# Patient Record
Sex: Female | Born: 1985 | Race: White | Hispanic: No | Marital: Married | Smoking: Former smoker
Health system: Southern US, Community
[De-identification: ages and names within clinical notes are randomized; demographics above are authoritative.]

## PROBLEM LIST (undated history)

## (undated) DIAGNOSIS — D649 Anemia, unspecified: Secondary | ICD-10-CM

## (undated) DIAGNOSIS — R87619 Unspecified abnormal cytological findings in specimens from cervix uteri: Secondary | ICD-10-CM

## (undated) DIAGNOSIS — N809 Endometriosis, unspecified: Secondary | ICD-10-CM

## (undated) HISTORY — DX: Anemia, unspecified: D64.9

## (undated) HISTORY — DX: Unspecified abnormal cytological findings in specimens from cervix uteri: R87.619

## (undated) HISTORY — PX: OTHER SURGICAL HISTORY: SHX169

## (undated) HISTORY — DX: Endometriosis, unspecified: N80.9

---

## 2008-08-20 ENCOUNTER — Ambulatory Visit: Payer: Self-pay | Admitting: Family Medicine

## 2008-10-08 ENCOUNTER — Ambulatory Visit: Payer: Self-pay

## 2009-02-20 DIAGNOSIS — R87619 Unspecified abnormal cytological findings in specimens from cervix uteri: Secondary | ICD-10-CM

## 2009-02-20 DIAGNOSIS — N809 Endometriosis, unspecified: Secondary | ICD-10-CM

## 2009-02-20 HISTORY — PX: LAPAROSCOPIC CHOLECYSTECTOMY: SUR755

## 2009-02-20 HISTORY — DX: Endometriosis, unspecified: N80.9

## 2009-02-20 HISTORY — DX: Unspecified abnormal cytological findings in specimens from cervix uteri: R87.619

## 2009-03-21 ENCOUNTER — Observation Stay: Payer: Self-pay

## 2009-03-22 ENCOUNTER — Inpatient Hospital Stay: Payer: Self-pay

## 2009-07-22 ENCOUNTER — Emergency Department: Payer: Self-pay | Admitting: Emergency Medicine

## 2009-08-02 ENCOUNTER — Ambulatory Visit: Payer: Self-pay | Admitting: General Surgery

## 2012-03-16 IMAGING — CR DG CHOLANGIOGRAM OPERATIVE
1 series · 1 of 1 positions shown · non-contrast
Comparison: none

REASON FOR EXAM: cholelithiasis
COMMENTS:

PROCEDURE:     DXR - DXR CHOLANGIOGRAM OP (INITIAL)  - August 02, 2009  [DATE]
RESULT:     A single spot film from an operative cholangiogram is submitted.
The visualized portions of the intrahepatic ducts appear normal. The common
bile duct is normal in caliber. No filling defects are identified.

[chole]
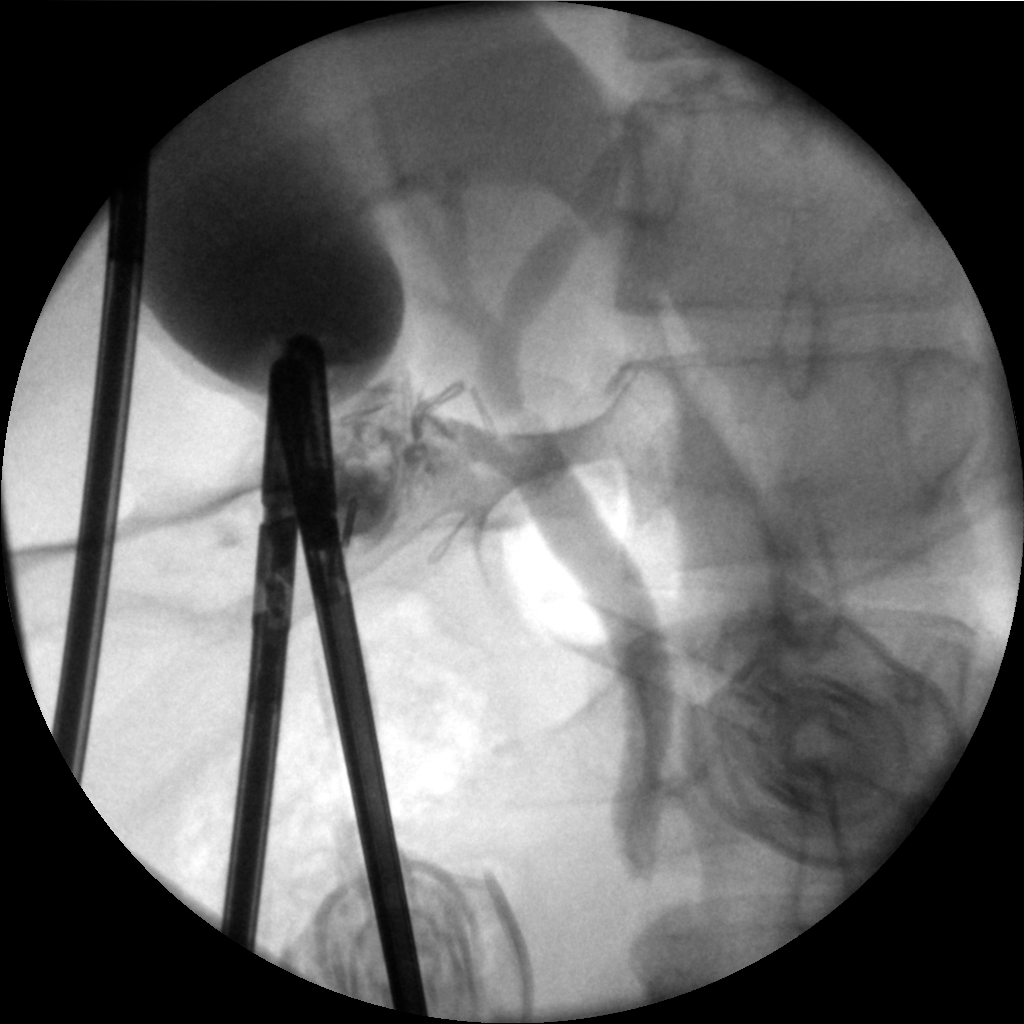

[1 of 1 positions shown; findings below may reference images not displayed]

IMPRESSION: I do not see evidence of a retained common bile duct stone.
Further interpretation is deferred to Dr. Arctica.

## 2014-04-20 LAB — HM PAP SMEAR

## 2017-04-30 ENCOUNTER — Telehealth: Payer: Self-pay

## 2017-04-30 NOTE — Telephone Encounter (Signed)
Let me know what questions she has

## 2017-04-30 NOTE — Telephone Encounter (Signed)
Pt has questions about nexplanon removal.  [phone] [facility].

## 2017-05-02 NOTE — Telephone Encounter (Signed)
Left detailed msg to call c exact questions she has.

## 2017-05-08 NOTE — Telephone Encounter (Signed)
Pt hasn't returned call.  Msg closed. 

## 2017-05-11 ENCOUNTER — Encounter: Payer: Self-pay | Admitting: Advanced Practice Midwife

## 2017-05-11 ENCOUNTER — Ambulatory Visit (INDEPENDENT_AMBULATORY_CARE_PROVIDER_SITE_OTHER): Payer: Medicaid Other | Admitting: Advanced Practice Midwife

## 2017-05-11 ENCOUNTER — Telehealth: Payer: Self-pay | Admitting: Advanced Practice Midwife

## 2017-05-11 VITALS — BP 136/62 | HR 84 | Ht 63.0 in | Wt 154.0 lb

## 2017-05-11 DIAGNOSIS — Z01419 Encounter for gynecological examination (general) (routine) without abnormal findings: Secondary | ICD-10-CM

## 2017-05-11 DIAGNOSIS — Z113 Encounter for screening for infections with a predominantly sexual mode of transmission: Secondary | ICD-10-CM

## 2017-05-11 DIAGNOSIS — Z0000 Encounter for general adult medical examination without abnormal findings: Secondary | ICD-10-CM

## 2017-05-11 DIAGNOSIS — Z124 Encounter for screening for malignant neoplasm of cervix: Secondary | ICD-10-CM

## 2017-05-11 NOTE — Telephone Encounter (Signed)
Patient scheduled 3/29 for nexplanon replacement with JEG

## 2017-05-14 ENCOUNTER — Encounter: Payer: Self-pay | Admitting: Advanced Practice Midwife

## 2017-05-14 NOTE — Patient Instructions (Signed)
Preventive Care 18-39 Years, Female Preventive care refers to lifestyle choices and visits with your health care provider that can promote health and wellness. What does preventive care include?  A yearly physical exam. This is also called an annual well check.  Dental exams once or twice a year.  Routine eye exams. Ask your health care provider how often you should have your eyes checked.  Personal lifestyle choices, including: ? Daily care of your teeth and gums. ? Regular physical activity. ? Eating a healthy diet. ? Avoiding tobacco and drug use. ? Limiting alcohol use. ? Practicing safe sex. ? Taking vitamin and mineral supplements as recommended by your health care provider. What happens during an annual well check? The services and screenings done by your health care provider during your annual well check will depend on your age, overall health, lifestyle risk factors, and family history of disease. Counseling Your health care provider may ask you questions about your:  Alcohol use.  Tobacco use.  Drug use.  Emotional well-being.  Home and relationship well-being.  Sexual activity.  Eating habits.  Work and work Statistician.  Method of birth control.  Menstrual cycle.  Pregnancy history.  Screening You may have the following tests or measurements:  Height, weight, and BMI.  Diabetes screening. This is done by checking your blood sugar (glucose) after you have not eaten for a while (fasting).  Blood pressure.  Lipid and cholesterol levels. These may be checked every 5 years starting at age 38.  Skin check.  Hepatitis C blood test.  Hepatitis B blood test.  Sexually transmitted disease (STD) testing.  BRCA-related cancer screening. This may be done if you have a family history of breast, ovarian, tubal, or peritoneal cancers.  Pelvic exam and Pap test. This may be done every 3 years starting at age 38. Starting at age 30, this may be done  every 5 years if you have a Pap test in combination with an HPV test.  Discuss your test results, treatment options, and if necessary, the need for more tests with your health care provider. Vaccines Your health care provider may recommend certain vaccines, such as:  Influenza vaccine. This is recommended every year.  Tetanus, diphtheria, and acellular pertussis (Tdap, Td) vaccine. You may need a Td booster every 10 years.  Varicella vaccine. You may need this if you have not been vaccinated.  HPV vaccine. If you are 39 or younger, you may need three doses over 6 months.  Measles, mumps, and rubella (MMR) vaccine. You may need at least one dose of MMR. You may also need a second dose.  Pneumococcal 13-valent conjugate (PCV13) vaccine. You may need this if you have certain conditions and were not previously vaccinated.  Pneumococcal polysaccharide (PPSV23) vaccine. You may need one or two doses if you smoke cigarettes or if you have certain conditions.  Meningococcal vaccine. One dose is recommended if you are age 68-21 years and a first-year college student living in a residence hall, or if you have one of several medical conditions. You may also need additional booster doses.  Hepatitis A vaccine. You may need this if you have certain conditions or if you travel or work in places where you may be exposed to hepatitis A.  Hepatitis B vaccine. You may need this if you have certain conditions or if you travel or work in places where you may be exposed to hepatitis B.  Haemophilus influenzae type b (Hib) vaccine. You may need this  if you have certain risk factors.  Talk to your health care provider about which screenings and vaccines you need and how often you need them. This information is not intended to replace advice given to you by your health care provider. Make sure you discuss any questions you have with your health care provider. Document Released: 04/04/2001 Document Revised:  10/27/2015 Document Reviewed: 12/08/2014 Elsevier Interactive Patient Education  2018 Elsevier Inc.  

## 2017-05-14 NOTE — Telephone Encounter (Signed)
Nexplanon reserved or this patient.

## 2017-05-14 NOTE — Progress Notes (Addendum)
Patient ID: Megan Bell, female   DOB: [birth year], 32 y.o.   MRN: [id number]     Gynecology Annual Exam   PCP: Patient, No Pcp Per  Chief Complaint:  Chief Complaint  Patient presents with  . Gynecologic Exam    needs nexplanon removal/reinsertion; options in between now and insertion    History of Present Illness: Patient is a 32 y.o. G1P1001 presents for annual exam. The patient has no complaints today. She is due to have her Nexplanon removed and a new one inserted. She will schedule that appointment.   LMP: Patient's last menstrual period was 05/05/2017. She had a period beginning March 16 which was the first period she has had in 9 years.  Postcoital Bleeding: no Dysmenorrhea: no  The patient is sexually active. She currently uses Nexplanon for contraception. She denies dyspareunia.  The patient does perform self breast exams.  There is no notable family history of breast or ovarian cancer in her family. Her paternal grandmother was diagnosed with breast cancer at age greater than 1.  The patient wears seatbelts: yes.   The patient has regular exercise: yes.  She is a 1/2 PPD cigarette smoker x 8 years. She is not ready to quit but is encouraged to quit.   The patient denies current symptoms of depression.  She has some anxiety but copes well without medication.  Review of Systems: Review of Systems  Constitutional: Negative.   HENT: Negative.   Eyes: Negative.   Respiratory: Negative.   Cardiovascular: Negative.   Gastrointestinal: Negative.   Genitourinary: Negative.   Musculoskeletal: Negative.   Skin: Negative.   Neurological: Negative.   Endo/Heme/Allergies: Negative.   Psychiatric/Behavioral: Negative.     Past Medical History:  Past Medical History:  Diagnosis Date  . Abnormal Pap smear of cervix 2011   didn't f/u  . Endometriosis 2011   of ovarian cyst during preg on u/s 2011    Past Surgical History:  Past Surgical History:  Procedure  Laterality Date  . LAPAROSCOPIC CHOLECYSTECTOMY  2011   Dr. Evette Cristal    Gynecologic History:  Patient's last menstrual period was 05/05/2017. Contraception: Nexplanon Last Pap: 3 years ago Results were: low-grade squamous intraepithelial neoplasia (LGSIL - encompassing HPV,mild dysplasia,CIN I) She has been unable to afford f/u until now due to lack of insurance.  Obstetric History: G1P1001  Family History:  Family History  Problem Relation Age of Onset  . Heart disease Maternal Grandfather        triple bypass  . Breast cancer Paternal Grandmother 36    Social History:  Social History   Socioeconomic History  . Marital status: Single    Spouse name: Not on file  . Number of children: 1  . Years of education: 38  . Highest education level: Not on file  Occupational History  . Occupation: pharm tech    Comment: Designer, jewellery  Social Needs  . Financial resource strain: Not on file  . Food insecurity:    Worry: Not on file    Inability: Not on file  . Transportation needs:    Medical: Not on file    Non-medical: Not on file  Tobacco Use  . Smoking status: Current Every Day Smoker    Packs/day: 1.00  . Smokeless tobacco: Never Used  Substance and Sexual Activity  . Alcohol use: Yes    Comment: 2/hs  . Drug use: No  . Sexual activity: Yes    Birth control/protection: Implant  Lifestyle  .  Physical activity:    Days per week: Not on file    Minutes per session: Not on file  . Stress: Not on file  Relationships  . Social connections:    Talks on phone: Not on file    Gets together: Not on file    Attends religious service: Not on file    Active member of club or organization: Not on file    Attends meetings of clubs or organizations: Not on file    Relationship status: Not on file  . Intimate partner violence:    Fear of current or ex partner: Not on file    Emotionally abused: Not on file    Physically abused: Not on file    Forced sexual activity: Not on  file  Other Topics Concern  . Not on file  Social History Narrative  . Not on file    Allergies:  No Known Allergies  Medications: Prior to Admission medications   Medication Sig Start Date End Date Taking? Authorizing Provider  etonogestrel (NEXPLANON) 68 MG IMPL implant Inject 1 each into the skin continuous.   Yes [provider]  ibuprofen (ADVIL,MOTRIN) 200 MG tablet Take 2 tablets by mouth daily.   Yes [provider]    Physical Exam Vitals: Blood pressure 136/62, pulse 84, height 5\' 3"  (1.6 m), weight 154 lb (69.9 kg), last menstrual period 05/05/2017.  General: NAD HEENT: normocephalic, anicteric Thyroid: no enlargement, no palpable nodules Pulmonary: No increased work of breathing, CTAB Cardiovascular: RRR, distal pulses 2+ Breast: Breast symmetrical, no tenderness, no palpable nodules or masses, no skin or nipple retraction present, no nipple discharge.  No axillary or supraclavicular lymphadenopathy. Abdomen: NABS, soft, non-tender, non-distended.  Umbilicus without lesions.  No hepatomegaly, splenomegaly or masses palpable. No evidence of hernia  Genitourinary:  External: Normal external female genitalia.  Normal urethral meatus, normal Bartholin's and Skene's glands.    Vagina: Normal vaginal mucosa, no evidence of prolapse.    Cervix: Grossly normal in appearance, no bleeding  Uterus: deferred for no symptoms/shared decision making  Adnexa: deferred for no symptoms/shared decision making  Rectal: deferred  Lymphatic: no evidence of inguinal lymphadenopathy Extremities: no edema, erythema, or tenderness Neurologic: Grossly intact Psychiatric: mood appropriate, affect full   Assessment: 32 y.o. G1P1001 routine annual exam  Plan: Problem List Items Addressed This Visit    None    Visit Diagnoses    Well woman exam with routine gynecological exam    -  Primary   Relevant Orders   IGP,CtNgTv,Apt HPV,rfx16/18,45   Screen for sexually  transmitted diseases       Relevant Orders   IGP,CtNgTv,Apt HPV,rfx16/18,45   Cervical cancer screening       Relevant Orders   IGP,CtNgTv,Apt HPV,rfx16/18,45      2) STI screening  wasoffered and accepted  2)  ASCCP guidelines and rational discussed.  Patient opts for every 3 years screening interval if results of PAP today are normal  3) Contraception - the patient is currently using  Nexplanon.  She is happy with her current form of contraception and plans to continue  4) Routine healthcare maintenance including cholesterol, diabetes screening discussed Declines   5) Smoking cessation encouraged  6) Return for Nexplanon removal and reinsertion appointment and in 1 year for annual.   Megan Bell, CNM [facility], [facility] 05/14/2017, 1:34 PM

## 2017-05-15 LAB — IGP,CTNGTV,APT HPV,RFX16/18,45
CHLAMYDIA, NUC. ACID AMP: NEGATIVE
Gonococcus, Nuc. Acid Amp: NEGATIVE
HPV Aptima: NEGATIVE
PAP SMEAR COMMENT: 0
Trich vag by NAA: NEGATIVE

## 2017-05-18 ENCOUNTER — Ambulatory Visit (INDEPENDENT_AMBULATORY_CARE_PROVIDER_SITE_OTHER): Payer: Medicaid Other | Admitting: Advanced Practice Midwife

## 2017-05-18 ENCOUNTER — Encounter: Payer: Self-pay | Admitting: Advanced Practice Midwife

## 2017-05-18 VITALS — BP 124/72 | HR 76 | Ht 63.0 in | Wt 152.0 lb

## 2017-05-18 DIAGNOSIS — Z3046 Encounter for surveillance of implantable subdermal contraceptive: Secondary | ICD-10-CM

## 2017-05-18 DIAGNOSIS — Z30017 Encounter for initial prescription of implantable subdermal contraceptive: Secondary | ICD-10-CM

## 2017-05-18 NOTE — Telephone Encounter (Signed)
Nexplanon received/charged 05/18/17

## 2017-05-18 NOTE — Progress Notes (Signed)
GYNECOLOGY PROCEDURE NOTE  Nexplanon removal discussed in detail.  Risks of infection, bleeding, nerve injury all reviewed.  Patient understands risks and desires to proceed.  Verbal consent obtained.  Patient is certain she wants the Nexplanon removed.  All questions answered.  Procedure: Patient placed in dorsal supine with right arm above head, elbow flexed at 90 degrees, arm resting on examination table.  Nexplanon identified without problems.  Betadine scrub x3.  2 ml of 1% lidocaine injected under Nexplanon device without problems.  Sterile gloves applied.  Small 0.5cm incision made at distal tip of Nexplanon device with 11 blade scalpel.  Nexplanon brought to incision and grasped with a small kelly clamp.  Nexplanon removed intact without problems.  Pressure applied to incision.  Patient tolerated procedure well.  No complications.   Assessment: 32 y.o. year old female now s/p uncomplicated Nexplanon removal.  Plan: 1.  Patient given post procedure precautions and asked to call for fever, chills, redness or drainage from her incision, bleeding from incision.  She understands she will likely have a small bruise near site of removal and can remove bandage tomorrow and steri-strips in approximately 1 week.  2) Contraception: reinsertion of Nexplanon today. See following note  Tresea MallJane Kaylean Tupou, [place]   [id number] for lidocaine block, [phone] for nexplanon removal     GYNECOLOGY PROCEDURE NOTE  Patient is a 32 y.o. G1P1001 presenting for Nexplanon insertion as her desired means of contraception.  She provided informed consent, signed copy in the chart, time out was performed. Self reported LMP of Patient's last menstrual period was 05/05/2017.  She understands that Nexplanon is a progesterone only therapy, and that patients often  have irregular and unpredictable vaginal bleeding or amenorrhea. She understands that other side effects are possible related to systemic progesterone, including but  not limited to, headaches, breast tenderness, nausea, and irritability. While effective at preventing pregnancy long acting reversible contraceptives do not prevent transmission of sexually transmitted diseases and use of barrier methods for this purpose was discussed. The placement procedure for Nexplanon was reviewed with the patient in detail including risks of nerve injury, infection, bleeding and injury to other muscles or tendons. She understands that the Nexplanon implant is good for 3 years and needs to be removed at the end of that time.  She understands that Nexplanon is an extremely effective option for contraception, with failure rate of <1%. This information is reviewed today and all questions were answered. Informed consent was obtained, both verbally and written.   The patient is healthy and has no contraindications to Nexplanon use.   Procedure Appropriate time out taken.  Patient in dorsal supine with right arm above head, elbow flexed at 90 degrees, arm resting on examination table.  Nexplanon removed form sterile blister packaging,  Device confirmed in needle, before inserting full length of needle into previous Nexplanon trac, tenting up the skin as the needle was advanced.  The drug eluding rod was then deployed by pulling back the slider per the manufactures recommendation.  The implant was palpable by the clinician as well as the patient.  The insertion site covered dressed with a band aid before applying  a kerlex bandage pressure dressing..Minimal blood loss was noted during the procedure.  The patient tolerated the procedure well.   She was instructed to wear the bandage for 24 hours, call with any signs of infection.  She was given the Nexplanon card and instructed to have the rod removed in 3 years.  Tresea MallJane Merwin Breden,  CNM   Charge [phone] for nexplanon device, CPT [id number] for procedure J2001 for lidocaine administration Modifer 25, plus Modifer 79 is done during a global billing  visit

## 2017-07-03 ENCOUNTER — Encounter: Payer: Self-pay | Admitting: Advanced Practice Midwife

## 2017-07-04 ENCOUNTER — Telehealth: Payer: Self-pay

## 2017-07-04 ENCOUNTER — Other Ambulatory Visit: Payer: Self-pay | Admitting: Advanced Practice Midwife

## 2017-07-04 DIAGNOSIS — Z3200 Encounter for pregnancy test, result unknown: Secondary | ICD-10-CM

## 2017-07-04 NOTE — Telephone Encounter (Signed)
Pt calling triage today. Has nexplanon and having some abdominal pain, breast tenderness, late on period and usually has them monthly with nexplanon. Pt has taken tests and all were obvious negative, but 1 she said was questionable and she saw a very faint line. Pt does not want to wait for an appt and wants to be seen today since she has availability today. She said she would go to [facility] to see what is going on. I advised pt to have them fax over visit from today if she goes. Pt will call back if she needs to schedule a follow up visit with more concerns. Pt very appreciative of call back and help.

## 2017-07-04 NOTE — Telephone Encounter (Signed)
Please advise scheduling lab .

## 2017-07-04 NOTE — Progress Notes (Signed)
Beta Hcg lab order entered for patient to come in for lab draw.

## 2017-07-05 ENCOUNTER — Telehealth: Payer: Self-pay | Admitting: Advanced Practice Midwife

## 2017-07-05 NOTE — Telephone Encounter (Signed)
Please schedule lab appointment for this patient. Thank you,  [name]    ----- Message -----  From: Kathi Ludwig  Sent: 07/04/2017  7:53 AM  To: Tresea Mall, CNM  Subject: Appointment Request                 ----- Message from Kathi Ludwig sent at 07/04/2017 7:53 AM EDT -----      ----- Message from Elisabeth Most D sent at 07/03/2017 5:22 PM -----   Appointment Request From: Megan Bell    With Provider: Tresea Mall, CNM [[facility]]    Preferred Date Range: 07/04/2017 - 07/04/2017    Preferred Times: Wednesday Afternoon    Reason for visit: Request an Appointment    Comments:  Blood work to check hormone levels and to check for pregnancy   Left voicemail for patient to call back to be schedule

## 2019-08-13 ENCOUNTER — Other Ambulatory Visit: Payer: Self-pay

## 2019-08-13 ENCOUNTER — Encounter: Payer: Self-pay | Admitting: Advanced Practice Midwife

## 2019-08-13 ENCOUNTER — Ambulatory Visit (INDEPENDENT_AMBULATORY_CARE_PROVIDER_SITE_OTHER): Payer: Medicaid Other | Admitting: Advanced Practice Midwife

## 2019-08-13 VITALS — BP 122/74 | Ht 64.0 in | Wt 204.0 lb

## 2019-08-13 DIAGNOSIS — Z0000 Encounter for general adult medical examination without abnormal findings: Secondary | ICD-10-CM

## 2019-08-13 DIAGNOSIS — Z3046 Encounter for surveillance of implantable subdermal contraceptive: Secondary | ICD-10-CM | POA: Diagnosis not present

## 2019-08-13 NOTE — Progress Notes (Signed)
Gynecology Annual Exam   PCP: Patient, No Pcp Per  Chief Complaint:  Chief Complaint  Patient presents with  . Annual Exam    History of Present Illness: Patient is a 34 y.o. G1P1001 presents for annual exam. The patient has no gyn complaints today. She reports some weight gain during covid due to stress, lack of exercise, soda drinking. She would like to have her Nexplanon out today as she and her husband are contemplating conception. Her main question regarding that is the condition of her teeth. She has significant decay and denies infection. She wonders if that is a concern for a possible pregnancy. Her long term repair will be full extraction with dentures.  LMP: No LMP recorded. Average Interval: regular, every 2 months  Duration of flow: 2 or 3 days Heavy Menses: no Clots: no Intermenstrual Bleeding: no Postcoital Bleeding: no Dysmenorrhea: no  The patient is sexually active. She currently uses Nexplanon for contraception. She denies dyspareunia.  The patient does perform self breast exams.  There is no notable family history of breast or ovarian cancer in her family.  The patient wears seatbelts: yes.   The patient has regular exercise: she is now walking some days and hopes to increase her physical activity level. Her main dietary weakness is soda drinking. She has been making efforts to eat healthier. She does admit 1 or 2 mixed drinks about 3-4 nights per week. She is trying to cut down/quit smoking and is down to 1/2 PPD from 1.5 PPD. She admits adequate sleep.    The patient denies current symptoms of depression.  She does have some situational anxiety related to custody of her oldest child.  Review of Systems: Review of Systems  Constitutional: Negative for chills and fever.  HENT: Negative for congestion, ear discharge, ear pain, hearing loss, sinus pain and sore throat.        Positive for tooth decay  Eyes: Negative for blurred vision and double vision.    Respiratory: Negative for cough, shortness of breath and wheezing.   Cardiovascular: Negative for chest pain, palpitations and leg swelling.  Gastrointestinal: Negative for abdominal pain, blood in stool, constipation, diarrhea, heartburn, melena, nausea and vomiting.  Genitourinary: Negative for dysuria, flank pain, frequency, hematuria and urgency.  Musculoskeletal: Negative for back pain, joint pain and myalgias.  Skin: Negative for itching and rash.  Neurological: Negative for dizziness, tingling, tremors, sensory change, speech change, focal weakness, seizures, loss of consciousness, weakness and headaches.  Endo/Heme/Allergies: Negative for environmental allergies. Does not bruise/bleed easily.  Psychiatric/Behavioral: Negative for depression, hallucinations, memory loss, substance abuse and suicidal ideas. The patient is not nervous/anxious and does not have insomnia.     Past Medical History:  There are no problems to display for this patient.   Past Surgical History:  Past Surgical History:  Procedure Laterality Date  . LAPAROSCOPIC CHOLECYSTECTOMY  2011   Dr. Evette Cristal    Gynecologic History:  No LMP recorded. Contraception: Nexplanon Last Pap: 2 years ago Results were: no abnormalities   Obstetric History: G1P1001  Family History:  Family History  Problem Relation Age of Onset  . Heart disease Maternal Grandfather        triple bypass  . Breast cancer Paternal Grandmother 41    Social History:  Social History   Socioeconomic History  . Marital status: Single    Spouse name: Not on file  . Number of children: 1  . Years of education: 7  . Highest  education level: Not on file  Occupational History  . Occupation: pharm tech    Comment: Public house manager  Tobacco Use  . Smoking status: Current Every Day Smoker    Packs/day: 0.50    Types: Cigarettes, E-cigarettes  . Smokeless tobacco: Never Used  Vaping Use  . Vaping Use: Never used  Substance and Sexual  Activity  . Alcohol use: Yes    Comment: 2/hs  . Drug use: No  . Sexual activity: Yes    Birth control/protection: Implant  Other Topics Concern  . Not on file  Social History Narrative  . Not on file   Social Determinants of Health   Financial Resource Strain:   . Difficulty of Paying Living Expenses:   Food Insecurity:   . Worried About Charity fundraiser in the Last Year:   . Arboriculturist in the Last Year:   Transportation Needs:   . Film/video editor (Medical):   Marland Kitchen Lack of Transportation (Non-Medical):   Physical Activity:   . Days of Exercise per Week:   . Minutes of Exercise per Session:   Stress:   . Feeling of Stress :   Social Connections:   . Frequency of Communication with Friends and Family:   . Frequency of Social Gatherings with Friends and Family:   . Attends Religious Services:   . Active Member of Clubs or Organizations:   . Attends Archivist Meetings:   Marland Kitchen Marital Status:   Intimate Partner Violence:   . Fear of Current or Ex-Partner:   . Emotionally Abused:   Marland Kitchen Physically Abused:   . Sexually Abused:     Allergies:  No Known Allergies  Medications: Prior to Admission medications   Medication Sig Start Date End Date Taking? Authorizing Provider  ibuprofen (ADVIL,MOTRIN) 200 MG tablet Take 2 tablets by mouth daily.   Yes [provider]  Cetirizine-Pseudoephedrine (ZYRTEC-D PO) Take by mouth.    [provider]    Physical Exam Vitals: Blood pressure 122/74, height 5\' 4"  (1.626 m), weight 204 lb (92.5 kg).  General: NAD HEENT: normocephalic, anicteric, significant tooth decay, gums without significant inflammation Thyroid: no enlargement, no palpable nodules Pulmonary: No increased work of breathing, CTAB Cardiovascular: RRR, distal pulses 2+ Breast: Breast symmetrical, no tenderness, no palpable nodules or masses, no skin or nipple retraction present, no nipple discharge.  No axillary or supraclavicular  lymphadenopathy. Abdomen: NABS, soft, non-tender, non-distended.  Umbilicus without lesions.  No hepatomegaly, splenomegaly or masses palpable. No evidence of hernia  Genitourinary: deferred for no concerns and PAP interval Extremities: no edema, erythema, or tenderness Neurologic: Grossly intact Psychiatric: mood appropriate, affect full    Assessment: 34 y.o. G1P1001 routine annual exam  Plan: Problem List Items Addressed This Visit    None    Visit Diagnoses    Well woman exam without gynecological exam    -  Primary   Nexplanon removal          1) STI screening  was offered and declined  2)  ASCCP guidelines and rationale discussed.  Patient opts for every 3 years screening interval  3) Contraception - the patient is currently using  Nexplanon.  She is attempting to conceive within the next year and is fine either way if it happens or not   4) Routine healthcare maintenance including cholesterol, diabetes screening discussed managed by PCP   5) Smoking cessation encouraged  6) Increase healthy lifestyle, diet/exercise  7) Follow up with  dentist for tooth decay   8) Return in about 1 year (around 08/12/2020) for annual established gyn.   Tresea Mall, CNM [facility] 08/13/2019, 4:49 PM     GYNECOLOGY PROCEDURE NOTE  Nexplanon removal discussed in detail.  Risks of infection, bleeding, nerve injury all reviewed.  Patient understands risks and desires to proceed.  Verbal consent obtained.  Patient is certain she wants the Nexplanon removed.  All questions answered.  Procedure: Patient placed in dorsal supine with right arm above head, elbow flexed at 90 degrees, arm resting on examination table.  Nexplanon identified without problems.  Betadine scrub x2.  1.5 ml of 1% lidocaine injected under Nexplanon device without problems.  Sterile gloves applied.  Small 0.5cm incision made at distal tip of Nexplanon device with 11 blade scalpel.   Nexplanon brought to incision and grasped with a small kelly clamp.  Nexplanon removed intact without problems.  Pressure applied to incision.  Hemostasis obtained.  Steri-strips applied, followed by bandage and compression dressing.  Patient tolerated procedure well.  No complications.   Assessment: 34 y.o. year old female now s/p uncomplicated Nexplanon removal.  Plan: 1.  Patient given post procedure precautions and asked to call for fever, chills, redness or drainage from her incision, bleeding from incision.  She understands she will likely have a small bruise near site of removal and can remove bandage tomorrow and steri-strips in approximately 1 week.  2) Contraception: attempting conception  [id number] for lidocaine block, [id number] for nexplanon removal  Parke Poisson, CNM [facility] 08/13/2019, 5:05 PM

## 2019-10-15 DIAGNOSIS — Z20822 Contact with and (suspected) exposure to covid-19: Secondary | ICD-10-CM | POA: Diagnosis not present

## 2019-12-29 ENCOUNTER — Ambulatory Visit (INDEPENDENT_AMBULATORY_CARE_PROVIDER_SITE_OTHER): Payer: Medicaid Other | Admitting: Obstetrics and Gynecology

## 2019-12-29 ENCOUNTER — Other Ambulatory Visit (HOSPITAL_COMMUNITY)
Admission: RE | Admit: 2019-12-29 | Discharge: 2019-12-29 | Disposition: A | Discharge status: Home / Self Care | Payer: Medicaid Other | Source: Ambulatory Visit | Attending: Obstetrics and Gynecology | Admitting: Obstetrics and Gynecology

## 2019-12-29 ENCOUNTER — Other Ambulatory Visit: Payer: Self-pay

## 2019-12-29 ENCOUNTER — Encounter: Payer: Self-pay | Admitting: Obstetrics and Gynecology

## 2019-12-29 VITALS — Wt 197.4 lb

## 2019-12-29 DIAGNOSIS — Z113 Encounter for screening for infections with a predominantly sexual mode of transmission: Secondary | ICD-10-CM | POA: Insufficient documentation

## 2019-12-29 DIAGNOSIS — Z3480 Encounter for supervision of other normal pregnancy, unspecified trimester: Secondary | ICD-10-CM

## 2019-12-29 DIAGNOSIS — N926 Irregular menstruation, unspecified: Secondary | ICD-10-CM

## 2019-12-29 DIAGNOSIS — Z3689 Encounter for other specified antenatal screening: Secondary | ICD-10-CM

## 2019-12-29 LAB — POCT URINE PREGNANCY: Preg Test, Ur: POSITIVE — AB

## 2019-12-29 LAB — OB RESULTS CONSOLE VARICELLA ZOSTER ANTIBODY, IGG: Varicella: IMMUNE

## 2019-12-29 NOTE — Progress Notes (Signed)
Pt here for a New OB  Visit. Pt is un sure of when her last LMP was because she was on the Nexplanon.

## 2019-12-29 NOTE — Progress Notes (Signed)
New Obstetric Patient H&P    Chief Complaint: "Desires prenatal care"   History of Present Illness: Patient is a 34 y.o. G2P1001 Not Hispanic or Latino female, presents with amenorrhea and positive home pregnancy test. No LMP recorded (lmp unknown). Patient is pregnant. and is uncertain of her last period. Her last pap smear was 05/12/2019 and was NILM HPV negative.    She had a urine pregnancy test which was positive on 12/04/2019. Since her LMP she claims she has experienced fatigue, nausea, breast tenderness. She denies vaginal bleeding. Her past medical history is noncontributory. Her prior pregnancies are notable for no complication  Since her LMP, she admits to the use of tobacco products  no There are cats in the home in the home  yes indoor She admits close contact with children on a regular basis  yes  She has had chicken pox in the past yes She has had Tuberculosis exposures, symptoms, or previously tested positive for TB   no Current or past history of domestic violence. no  Genetic Screening/Teratology Counseling: (Includes patient, baby's father, or anyone in either family with:)   1. Patient's age >/= 13 at [facility]  no 2. Thalassemia ([place], [place], Mediterranean, or Asian background): MCV<80  no 3. Neural tube defect (meningomyelocele, spina bifida, anencephaly)  no 4. Congenital heart defect  no  5. Down syndrome  no 6. Tay-Sachs (Jewish, [place]  no 7. Canavan's Disease  no 8. Sickle cell disease or trait (African)  no  9. Hemophilia or other blood disorders  no  10. Muscular dystrophy  no  11. Cystic fibrosis  no  12. Huntington's Chorea  no  13. Mental retardation/autism  no 14. Other inherited genetic or chromosomal disorder  no 15. Maternal metabolic disorder (DM, PKU, etc)  no 16. Patient or FOB with a child with a birth defect not listed above no  16a. Patient or FOB with a birth defect themselves no 17. Recurrent pregnancy loss, or stillbirth  no    18. Any medications since LMP other than prenatal vitamins (include vitamins, supplements, OTC meds, drugs, alcohol)  no 19. Any other genetic/environmental exposure to discuss  no  Infection History:   1. Lives with someone with TB or TB exposed  no  2. Patient or partner has history of genital herpes  no 3. Rash or viral illness since LMP  no 4. History of STI (GC, CT, HPV, syphilis, HIV)  no 5. History of recent travel :  no  Other pertinent information:  no     Review of Systems:10 point review of systems negative unless otherwise noted in HPI  Past Medical History:  There are no problems to display for this patient.   Past Surgical History:  Past Surgical History:  Procedure Laterality Date  . LAPAROSCOPIC CHOLECYSTECTOMY  2011   Dr. Evette Cristal    Gynecologic History: No LMP recorded (lmp unknown). Patient is pregnant.  Obstetric History: G2P1001  Family History:  Family History  Problem Relation Age of Onset  . Heart disease Maternal Grandfather        triple bypass  . Breast cancer Paternal Grandmother 34    Social History:  Social History   Socioeconomic History  . Marital status: Single    Spouse name: Not on file  . Number of children: 1  . Years of education: 31  . Highest education level: Not on file  Occupational History  . Occupation: pharm tech    Comment: Designer, jewellery  Tobacco Use  . Smoking status: Current Every Day Smoker    Packs/day: 0.50    Types: Cigarettes, E-cigarettes  . Smokeless tobacco: Never Used  Vaping Use  . Vaping Use: Never used  Substance and Sexual Activity  . Alcohol use: Yes    Comment: 2/hs  . Drug use: No  . Sexual activity: Yes    Birth control/protection: Implant  Other Topics Concern  . Not on file  Social History Narrative  . Not on file   Social Determinants of Health   Financial Resource Strain:   . Difficulty of Paying Living Expenses: Not on file  Food Insecurity:   . Worried About Brewing technologist in the Last Year: Not on file  . Ran Out of Food in the Last Year: Not on file  Transportation Needs:   . Lack of Transportation (Medical): Not on file  . Lack of Transportation (Non-Medical): Not on file  Physical Activity:   . Days of Exercise per Week: Not on file  . Minutes of Exercise per Session: Not on file  Stress:   . Feeling of Stress : Not on file  Social Connections:   . Frequency of Communication with Friends and Family: Not on file  . Frequency of Social Gatherings with Friends and Family: Not on file  . Attends Religious Services: Not on file  . Active Member of Clubs or Organizations: Not on file  . Attends Banker Meetings: Not on file  . Marital Status: Not on file  Intimate Partner Violence:   . Fear of Current or Ex-Partner: Not on file  . Emotionally Abused: Not on file  . Physically Abused: Not on file  . Sexually Abused: Not on file    Allergies:  No Known Allergies  Medications: Prior to Admission medications   Medication Sig Start Date End Date Taking? Authorizing Provider  Cetirizine-Pseudoephedrine (ZYRTEC-D PO) Take by mouth.   Yes [provider]  ibuprofen (ADVIL,MOTRIN) 200 MG tablet Take 2 tablets by mouth daily.   Yes [provider]    Physical Exam Vitals: Weight 197 lb 6.4 oz (89.5 kg). Body mass index is 33.88 kg/m.   General: NAD HEENT: normocephalic, anicteric Thyroid: no enlargement, no palpable nodules Pulmonary: No increased work of breathing, CTAB Cardiovascular: RRR, distal pulses 2+ Abdomen: NABS, soft, non-tender, non-distended.  Umbilicus without lesions.  No hepatomegaly, splenomegaly or masses palpable. No evidence of hernia  Genitourinary:  External: Normal external female genitalia.  Normal urethral meatus, normal  Bartholin's and Skene's glands.    Vagina: Normal vaginal mucosa, no evidence of prolapse.    Cervix: Grossly normal in appearance, no bleeding  Uterus:  Non-enlarged,  mobile, normal contour.  No CMT  Adnexa: ovaries non-enlarged, no adnexal masses  Rectal: deferred Extremities: no edema, erythema, or tenderness Neurologic: Grossly intact Psychiatric: mood appropriate, affect full   Assessment: 34 y.o. G2P1001 at Unknown presenting to initiate prenatal care  Plan: 1) Avoid alcoholic beverages. 2) Patient encouraged not to smoke.  3) Discontinue the use of all non-medicinal drugs and chemicals.  4) Take prenatal vitamins daily.  5) Nutrition, food safety (fish, cheese advisories, and high nitrite foods) and exercise discussed. 6) Hospital and practice style discussed with cross coverage system.  7) Genetic Screening, such as with 1st Trimester Screening, cell free fetal DNA, AFP testing, and Ultrasound, as well as with amniocentesis and CVS as appropriate, is discussed with patient. At the conclusion of today's visit patient requested genetic  testing 8) Patient is asked about travel to areas at risk for the Zika virus, and counseled to avoid travel and exposure to mosquitoes or sexual partners who may have themselves been exposed to the virus. Testing is discussed, and will be ordered as appropriate.   Vena Austria, MD, Evern Core [facility], [facility] 12/29/2019, 3:24 PM

## 2019-12-30 LAB — RPR+RH+ABO+RUB AB+AB SCR+CB...
Antibody Screen: NEGATIVE
HIV Screen 4th Generation wRfx: NONREACTIVE
Hematocrit: 39 % (ref 34.0–46.6)
Hemoglobin: 13.5 g/dL (ref 11.1–15.9)
Hepatitis B Surface Ag: NEGATIVE
MCH: 33.3 pg — ABNORMAL HIGH (ref 26.6–33.0)
MCHC: 34.6 g/dL (ref 31.5–35.7)
MCV: 96 fL (ref 79–97)
Platelets: 284 10*3/uL (ref 150–450)
RBC: 4.06 x10E6/uL (ref 3.77–5.28)
RDW: 12 % (ref 11.7–15.4)
RPR Ser Ql: NONREACTIVE
Rh Factor: POSITIVE
Rubella Antibodies, IGG: 17.3 index (ref >0.99)
Varicella zoster IgG: 472 index (ref >165)
WBC: 10 10*3/uL (ref 3.4–10.8)

## 2019-12-31 LAB — CERVICOVAGINAL ANCILLARY ONLY
Chlamydia: NEGATIVE
Comment: NEGATIVE
Comment: NORMAL
Neisseria Gonorrhea: NEGATIVE

## 2019-12-31 LAB — URINE CULTURE

## 2020-01-02 DIAGNOSIS — Z3480 Encounter for supervision of other normal pregnancy, unspecified trimester: Secondary | ICD-10-CM | POA: Insufficient documentation

## 2020-01-13 ENCOUNTER — Encounter: Payer: Medicaid Other | Admitting: Obstetrics

## 2020-01-13 ENCOUNTER — Ambulatory Visit: Payer: Medicaid Other

## 2020-01-23 ENCOUNTER — Encounter: Payer: Self-pay | Admitting: Obstetrics and Gynecology

## 2020-01-23 ENCOUNTER — Ambulatory Visit (INDEPENDENT_AMBULATORY_CARE_PROVIDER_SITE_OTHER): Payer: Medicaid Other

## 2020-01-23 ENCOUNTER — Other Ambulatory Visit: Payer: Self-pay

## 2020-01-23 ENCOUNTER — Ambulatory Visit (INDEPENDENT_AMBULATORY_CARE_PROVIDER_SITE_OTHER): Payer: Medicaid Other | Admitting: Obstetrics and Gynecology

## 2020-01-23 VITALS — BP 120/80 | Wt 194.0 lb

## 2020-01-23 DIAGNOSIS — Z1379 Encounter for other screening for genetic and chromosomal anomalies: Secondary | ICD-10-CM | POA: Diagnosis not present

## 2020-01-23 DIAGNOSIS — Z3480 Encounter for supervision of other normal pregnancy, unspecified trimester: Secondary | ICD-10-CM | POA: Diagnosis not present

## 2020-01-23 DIAGNOSIS — Z3689 Encounter for other specified antenatal screening: Secondary | ICD-10-CM

## 2020-01-23 DIAGNOSIS — Z3A13 13 weeks gestation of pregnancy: Secondary | ICD-10-CM

## 2020-01-23 DIAGNOSIS — Z3481 Encounter for supervision of other normal pregnancy, first trimester: Secondary | ICD-10-CM

## 2020-01-23 LAB — POCT URINALYSIS DIPSTICK OB
Glucose, UA: NEGATIVE
POC,PROTEIN,UA: NEGATIVE

## 2020-01-23 NOTE — Progress Notes (Signed)
    Routine Prenatal Care Visit  Subjective  Megan Bell is a 34 y.o. G2P1001 at Unknown being seen today for ongoing prenatal care.  She is currently monitored for the following issues for this low-risk pregnancy and has Supervision of other normal pregnancy, antepartum on their problem list.  ----------------------------------------------------------------------------------- Patient reports nausea which has improved over recent weeks.  Concerns regarding dental care at this time - patient requires significant dental work for poor dentition. Has tooth that regularly abscesses.  . Vag. Bleeding: None.  Movement: Absent. Denies leaking of fluid.  ----------------------------------------------------------------------------------- The following portions of the patient's history were reviewed and updated as appropriate: allergies, current medications, past family history, past medical history, past social history, past surgical history and problem list. Problem list updated.   Objective  Blood pressure 120/80, weight 194 lb (88 kg). Pregravid weight 205 lb (93 kg) Total Weight Gain -11 lb (-4.99 kg) Urinalysis:      Fetal Status: Fetal Heart Rate (bpm): 157   Movement: Absent     General:  Alert, oriented and cooperative. Patient is in no acute distress.  Skin: Skin is warm and dry. No rash noted.   Cardiovascular: Normal heart rate noted  Respiratory: Normal respiratory effort, no problems with respiration noted  Abdomen: Soft, gravid, appropriate for gestational age. Pain/Pressure: Absent     Pelvic:  Cervical exam deferred        Extremities: Normal range of motion.     ental Status: Normal mood and affect. Normal behavior. Normal judgment and thought content.     Assessment   34 y.o. G2P1001 at Unknown by  Not found. presenting for routine prenatal visit  Plan   pregnancy 2 Problems (from 12/29/19 to present)    No problems associated with this episode.       -Encouraged  patient to seek dental care during pregnancy. -NIPTs today. Reviewed genetic screening options.  Gestational age appropriate obstetric precautions including but not limited to vaginal bleeding, contractions, leaking of fluid and fetal movement were reviewed in detail with the patient.    Return in about 5 weeks (around 02/27/2020) for Anat scan & ROB.  Zipporah Plants, CNM, MSN [facility], [facility] 01/23/2020, 11:03 AM

## 2020-01-23 NOTE — Addendum Note (Signed)
Addended by: Cornelius Moras D on: 01/23/2020 11:08 AM   Modules accepted: Orders

## 2020-01-28 LAB — MATERNIT 21 PLUS CORE, BLOOD
Fetal Fraction: 13
Result (T21): NEGATIVE
Trisomy 13 (Patau syndrome): NEGATIVE
Trisomy 18 (Edwards syndrome): NEGATIVE
Trisomy 21 (Down syndrome): NEGATIVE

## 2020-02-17 ENCOUNTER — Other Ambulatory Visit: Payer: Self-pay | Admitting: Obstetrics and Gynecology

## 2020-02-17 DIAGNOSIS — Z3481 Encounter for supervision of other normal pregnancy, first trimester: Secondary | ICD-10-CM

## 2020-02-21 NOTE — L&D Delivery Note (Signed)
Delivery Note Megan Bell was checked at approximately [phone], and was found to be completely dilated. Pushing commenced shortly thereafter.  At 0902 a viable female was delivered vaginally (Presentation:    OA  ). The baby crowned slowly, and a nuchal cord was reduced on the perineum once the head delivered. With gentle downward guidance, the anterior shoulder delivered, followed by the posterior shoulder, assisted with gently upward guidance. The baby was placed immediately on the maternal abdomen for skin to skin care.   APGAR:8 9, ; weight- pending.   Placenta status:  delivered apontaneously,  .  Cord:  3 vessel, with the following complications:  -none.    Anesthesia:  epidural Episiotomy:  none Lacerations:  first degree Suture Repair: 3.0 vicryl rapide Est. Blood Loss (mL):  615  Mom to postpartum.  Baby to Couplet care / Skin to Skin.  Mirna [name] 07/27/2020, 9:30 AM

## 2020-02-27 ENCOUNTER — Ambulatory Visit (INDEPENDENT_AMBULATORY_CARE_PROVIDER_SITE_OTHER): Payer: Medicaid Other | Admitting: Obstetrics and Gynecology

## 2020-02-27 ENCOUNTER — Ambulatory Visit (INDEPENDENT_AMBULATORY_CARE_PROVIDER_SITE_OTHER): Payer: Medicaid Other

## 2020-02-27 ENCOUNTER — Other Ambulatory Visit: Payer: Self-pay

## 2020-02-27 ENCOUNTER — Encounter: Payer: Self-pay | Admitting: Obstetrics and Gynecology

## 2020-02-27 VITALS — BP 136/88 | HR 78 | Wt 202.0 lb

## 2020-02-27 DIAGNOSIS — Z3A18 18 weeks gestation of pregnancy: Secondary | ICD-10-CM

## 2020-02-27 DIAGNOSIS — Z3480 Encounter for supervision of other normal pregnancy, unspecified trimester: Secondary | ICD-10-CM

## 2020-02-27 DIAGNOSIS — Z3481 Encounter for supervision of other normal pregnancy, first trimester: Secondary | ICD-10-CM

## 2020-02-27 NOTE — Progress Notes (Signed)
Routine Prenatal Care Visit  Subjective  Megan Bell is a 35 y.o. G2P1001 at [redacted]w[redacted]d being seen today for ongoing prenatal care.  She is currently monitored for the following issues for this low-risk pregnancy and has Supervision of other normal pregnancy, antepartum on their problem list.  ----------------------------------------------------------------------------------- Patient reports no complaints.    . Vag. Bleeding: None.  Movement: Increased. Leaking Fluid denies.  ----------------------------------------------------------------------------------- The following portions of the patient's history were reviewed and updated as appropriate: allergies, current medications, past family history, past medical history, past social history, past surgical history and problem list. Problem list updated.  Objective  Blood pressure 136/88, pulse 78, weight 202 lb (91.6 kg). Pregravid weight 205 lb (93 kg) Total Weight Gain -3 lb (-1.361 kg) Urinalysis: Urine Protein    Urine Glucose    Fetal Status: Fetal Heart Rate (bpm): 147 ([place])   Movement: Increased     General:  Alert, oriented and cooperative. Patient is in no acute distress.  Skin: Skin is warm and dry. No rash noted.   Cardiovascular: Normal heart rate noted  Respiratory: Normal respiratory effort, no problems with respiration noted  Abdomen: Soft, gravid, appropriate for gestational age. Pain/Pressure: Present     Pelvic:  Cervical exam deferred        Extremities: Normal range of motion.  Edema: None  Mental Status: Normal mood and affect. Normal behavior. Normal judgment and thought content.   Imaging Results US OB Comp + 14 Wk  Result Date: 02/27/2020 Patient Name: Megan Bell DOB: 07/03/1985 MRN: [id number] ULTRASOUND REPORT Location: [facility] Date of Service: 02/27/2020 Indications:Anatomy Ultrasound Findings: Mason Jim intrauterine pregnancy is visualized with FHR at 147 BPM. Biometrics give an (U/S) Gestational age  of [redacted]w[redacted]d and an (U/S) EDD of 07/25/2020; this correlates with the clinically established Estimated Date of Delivery: None noted. Fetal presentation is Breech. EFW: 261 g ( 9 oz ). Placenta: anterior. Grade: 1 AFI: subjectively normal. Anatomic survey is incomplete for 3VV, LVOT, RVOT and normal; Gender - female.  Impression: 1. Unknown Viable Singleton Intrauterine pregnancy by U/S. 2. (U/S) EDD is consistent with Clinically established Estimated Date of Delivery: None noted. . 3. The ultrasound is incomplete for the structures listed above. Recommendations: 1.Clinical correlation with the patient's History and Physical Exam. 2. Follow up ultrasound in 2 week to complete anatomy scan. Megan Bell, RT There is a singleton gestation with subjectively normal amniotic fluid volume. The fetal biometry correlates with established dating. Detailed evaluation of the fetal anatomy was performed.The fetal anatomical survey appears within normal limits within the resolution of ultrasound as described above.  Not all structures were able to be visualized on today's study.  It is recommended that a follow-up ultrasound be performed to complete visualization of the unobserved structures today.  It must be noted that a normal ultrasound is unable to rule out fetal aneuploidy nor is it able to detect all possible malformations.    The ultrasound images and findings were reviewed by me and I agree with the above report. Thomasene Mohair, MD, [facility], [facility] 02/27/2020 12:12 PM       Assessment   34 y.o. G2P1001 at [redacted]w[redacted]d by  07/25/2020, by Ultrasound presenting for routine prenatal visit  Plan   pregnancy 2 Problems (from 12/29/19 to present)    Problem Noted Resolved   Supervision of other normal pregnancy, antepartum 01/02/2020 by Vena Austria, MD No     Preterm labor symptoms and general obstetric  precautions including but not limited to vaginal bleeding, contractions, leaking of  fluid and fetal movement were reviewed in detail with the patient. Please refer to After Visit Summary for other counseling recommendations.   Return in about 2 weeks (around 03/12/2020) for anatomy completion ultrasound and routine prenatal.   Thomasene Mohair, MD, [facility], [facility] 02/27/2020 12:26 PM

## 2020-03-12 ENCOUNTER — Other Ambulatory Visit: Payer: Self-pay

## 2020-03-12 ENCOUNTER — Ambulatory Visit (INDEPENDENT_AMBULATORY_CARE_PROVIDER_SITE_OTHER): Payer: Medicaid Other | Admitting: Obstetrics

## 2020-03-12 ENCOUNTER — Encounter: Payer: Medicaid Other | Admitting: Obstetrics and Gynecology

## 2020-03-12 ENCOUNTER — Ambulatory Visit (INDEPENDENT_AMBULATORY_CARE_PROVIDER_SITE_OTHER): Payer: Medicaid Other

## 2020-03-12 VITALS — BP 120/70 | Wt 194.0 lb

## 2020-03-12 DIAGNOSIS — O210 Mild hyperemesis gravidarum: Secondary | ICD-10-CM

## 2020-03-12 DIAGNOSIS — Z3480 Encounter for supervision of other normal pregnancy, unspecified trimester: Secondary | ICD-10-CM | POA: Diagnosis not present

## 2020-03-12 DIAGNOSIS — Z3A18 18 weeks gestation of pregnancy: Secondary | ICD-10-CM | POA: Diagnosis not present

## 2020-03-12 DIAGNOSIS — Z3A20 20 weeks gestation of pregnancy: Secondary | ICD-10-CM

## 2020-03-12 LAB — POCT URINALYSIS DIPSTICK
Blood, UA: NEGATIVE
Glucose, UA: NEGATIVE
Leukocytes, UA: NEGATIVE
Nitrite, UA: NEGATIVE
Protein, UA: POSITIVE — AB
Spec Grav, UA: >=1.03 — AB
Urobilinogen, UA: 0.2 U/dL
pH, UA: 5

## 2020-03-12 MED ORDER — PROMETHAZINE HCL 12.5 MG RE SUPP: 12.5000 mg | Freq: Four times a day (QID) | RECTAL | Status: DC | PRN

## 2020-03-12 NOTE — Progress Notes (Signed)
Routine Prenatal Care Visit  Subjective  Megan Bell is a 35 y.o. G2P1001 at [redacted]w[redacted]d being seen today for ongoing prenatal care.  She is currently monitored for the following issues for this low-risk pregnancy and has Supervision of other normal pregnancy, antepartum on their problem list.  ----------------------------------------------------------------------------------- Patient reports nausea, no contractions, no leaking and vomiting.  She rpeorts onging days of severe Nausea and vomiting, with periods of "normalcy". She is using Zofran which helps a bit. She is also a smoker who has cut way back. Spent the last three days home feeling nauseaous. Requesting Zofran renewal.  .  .   . Leaking Fluid denies.  ----------------------------------------------------------------------------------- The following portions of the patient's history were reviewed and updated as appropriate: allergies, current medications, past family history, past medical history, past social history, past surgical history and problem list. Problem list updated.  Objective  Blood pressure 120/70, weight 194 lb (88 kg). Pregravid weight 205 lb (93 kg) Total Weight Gain -11 lb (-4.99 kg) Urinalysis: Urine Protein    Urine Glucose    Fetal Status:           General:  Alert, oriented and cooperative. Patient is in no acute distress.  Skin: Skin is warm and dry. No rash noted.   Cardiovascular: Normal heart rate noted  Respiratory: Normal respiratory effort, no problems with respiration noted  Abdomen: Soft, gravid, appropriate for gestational age.       Pelvic:  Cervical exam deferred        Extremities: Normal range of motion.     Mental Status: Normal mood and affect. Normal behavior. Normal judgment and thought content.   Assessment   35 y.o. G2P1001 at [redacted]w[redacted]d by  07/25/2020, by Ultrasound presenting for routine prenatal visit  Plan   pregnancy 2 Problems (from 12/29/19 to present)    Problem Noted Resolved    Supervision of other normal pregnancy, antepartum 01/02/2020 by Vena Austria, MD No   Overview Addendum 03/12/2020 10:37 AM by Mirna [name], CNM    [facility]inic [facility] Prenatal Labs  Dating 13 week u/s Blood type: O/Positive/-- (11/08 1603)   Genetic Screen NIPS: diploid XY Antibody:Negative (11/08 1603)  Anatomic [place] Incomplete. 2nd scan 1/22-complete, normal female Rubella: 17.30 (11/08 1603)  Varicella: Immune  GTT Early:               Third trimester:  RPR: Non Reactive (11/08 1603)   Rhogam  HBsAg: Negative (11/08 1603)   TDaP vaccine                       Flu Shot: HIV: Non Reactive (11/08 1603)   Baby Food                                GBS:   Contraception  Pap:  CBB     CS/VBAC    Support Person            Previous Version       Preterm labor symptoms and general obstetric precautions including but not limited to vaginal bleeding, contractions, leaking of fluid and fetal movement were reviewed in detail with the patient. Please refer to After Visit Summary for other counseling recommendations.  Repeat anatomy scan today- normal female- "Flinn"   Will renew her Zofran and add Phenergan suppositories. Offered trip to hospital for IV fluids- she declines today, but will reconsider. Encouraged her to  reach out if the Phenergan is not helping. Gatorade, gingerale, small snacks advised. Return in about 4 weeks (around 04/09/2020) for return OB.  Mirna [name], CNM  03/12/2020 10:38 AM

## 2020-03-12 NOTE — Progress Notes (Signed)
ROB/Anatomy Scan- would like zofran RF

## 2020-03-15 ENCOUNTER — Other Ambulatory Visit: Payer: Self-pay | Admitting: Obstetrics

## 2020-03-15 DIAGNOSIS — O210 Mild hyperemesis gravidarum: Secondary | ICD-10-CM

## 2020-03-15 MED ORDER — ONDANSETRON HCL 8 MG PO TABS: 8.0000 mg | ORAL_TABLET | Freq: Three times a day (TID) | ORAL | 1 refills | Status: DC | PRN

## 2020-03-15 MED ORDER — PROMETHAZINE HCL 12.5 MG RE SUPP: 12.5000 mg | Freq: Four times a day (QID) | RECTAL | Status: DC | PRN

## 2020-03-15 NOTE — Progress Notes (Signed)
Patient was seen in the office before the weekend. She has been struglling with hyperemesis gravidarum Prescirbed again, Zofran and added Phenergan suppositories. She call the office today (03/15/2020) to share that the Rx did not go through to the pharmacy. I have called the pharmacy and given a verbal order.  This patient had ketonuria at last visit. Due to high levels of COVID patient at the hsoptial, she did not go for IV fluid hydration. Would strongly consider hospital outpatient treatment with IV fluids if she shows at the office again with N/V, or if medication RXs are not effective.  Mirna [name], CNM  03/15/2020 11:23 AM

## 2020-04-09 ENCOUNTER — Encounter: Payer: Self-pay | Admitting: Obstetrics & Gynecology

## 2020-04-09 ENCOUNTER — Ambulatory Visit (INDEPENDENT_AMBULATORY_CARE_PROVIDER_SITE_OTHER): Payer: Medicaid Other | Admitting: Obstetrics & Gynecology

## 2020-04-09 ENCOUNTER — Other Ambulatory Visit: Payer: Self-pay

## 2020-04-09 VITALS — BP 120/80 | Wt 203.0 lb

## 2020-04-09 DIAGNOSIS — Z3482 Encounter for supervision of other normal pregnancy, second trimester: Secondary | ICD-10-CM

## 2020-04-09 DIAGNOSIS — Z131 Encounter for screening for diabetes mellitus: Secondary | ICD-10-CM

## 2020-04-09 DIAGNOSIS — Z369 Encounter for antenatal screening, unspecified: Secondary | ICD-10-CM

## 2020-04-09 DIAGNOSIS — Z3A24 24 weeks gestation of pregnancy: Secondary | ICD-10-CM

## 2020-04-09 NOTE — Progress Notes (Signed)
  Subjective  Fetal Movement? yes Contractions? no Leaking Fluid? no Vaginal Bleeding? no Lower pelvic pressure at times Less nausea but still present Objective  BP 120/80   Wt 203 lb (92.1 kg)   LMP  (LMP Unknown)   BMI 34.84 kg/m  General: NAD Pumonary: no increased work of breathing Abdomen: gravid, non-tender Extremities: no edema Psychiatric: mood appropriate, affect full  Assessment  35 y.o. G2P1001 at [redacted]w[redacted]d by  07/25/2020, by Ultrasound presenting for routine prenatal visit  Plan   Problem List Items Addressed This Visit      Other   Supervision of other normal pregnancy, antepartum    Other Visit Diagnoses    [redacted] weeks gestation of pregnancy    -  Primary   Relevant Orders   28 Week RH+Panel   Screening for diabetes mellitus       Relevant Orders   28 Week RH+Panel   Prenatal screening encounter       Relevant Orders   28 Week RH+Panel      pregnancy 2 Problems (from 12/29/19 to present)    Problem Noted Resolved   Supervision of other normal pregnancy, antepartum 01/02/2020 by Vena Austria, MD No   Overview Addendum 04/09/2020  9:56 AM by Nadara Mustard, MD    [facility] Prenatal Labs  Dating 13 week u/s Blood type: O/Positive/-- (11/08 1603)   Genetic Screen NIPS: diploid XY Antibody:Negative (11/08 1603)  Anatomic [place] Incomplete. 2nd scan 1/22-complete, normal female Rubella: 17.30 (11/08 1603)  Varicella: Immune  GTT Early:               Third trimester:  RPR: Non Reactive (11/08 1603)   Rhogam n/a HBsAg: Negative (11/08 1603)   TDaP vaccine                       Flu Shot: HIV: Non Reactive (11/08 1603)   Baby Food      Breast                          GBS:   Contraception   POP then vasec; BTL if CS needed (consent [ ] ) Pap:  CBB  No   CS/VBAC N/A   Support Person Husband          Discussed breast feeding Discussed contraception. POP and later vasec, if NSVD.  BTL if CS is needed.  Will get tubal consent at later visit just in  case. PNV Discussed pais of pregnancy, heat rest Tylenol Discussed nausea, cont Zofran for now  , MD, [facility], [facility] 04/09/2020  9:56 AM

## 2020-04-09 NOTE — Patient Instructions (Addendum)
Thank you for choosing [facility]. As part of our ongoing efforts to improve patient experience, we would appreciate your feedback. Please fill out the short survey that you will receive by mail or MyChart. Your opinion is important to us! -Dr Mikaylah Libbey  Second Trimester of Pregnancy  The second trimester of pregnancy is from week 13 through week 27. This is months 4 through 6 of pregnancy. The second trimester is often a time when you feel your best. Your body has adjusted to being pregnant, and you begin to feel better physically. During the second trimester:  Morning sickness has lessened or stopped completely.  You may have more energy.  You may have an increase in appetite. The second trimester is also a time when the unborn baby (fetus) is growing rapidly. At the end of the sixth month, the fetus may be up to 12 inches long and weigh about 1 pounds. You will likely begin to feel the baby move (quickening) between 16 and 20 weeks of pregnancy. Body changes during your second trimester Your body continues to go through many changes during your second trimester. The changes vary and generally return to normal after the baby is born. Physical changes  Your weight will continue to increase. You will notice your lower abdomen bulging out.  You may begin to get stretch marks on your hips, abdomen, and breasts.  Your breasts will continue to grow and to become tender.  Dark spots or blotches (chloasma or mask of pregnancy) may develop on your face.  A dark line from your belly button to the pubic area (linea nigra) may appear.  You may have changes in your hair. These can include thickening of your hair, rapid growth, and changes in texture. Some people also have hair loss during or after pregnancy, or hair that feels dry or thin. Health changes  You may develop headaches.  You may have heartburn.  You may develop constipation.  You may develop hemorrhoids or swollen, bulging  veins (varicose veins).  Your gums may bleed and may be sensitive to brushing and flossing.  You may urinate more often because the fetus is pressing on your bladder.  You may have back pain. This is caused by: ? Weight gain. ? Pregnancy hormones that are relaxing the joints in your pelvis. ? A shift in weight and the muscles that support your balance. Follow these instructions at home: Medicines  Follow your health care provider's instructions regarding medicine use. Specific medicines may be either safe or unsafe to take during pregnancy. Do not take any medicines unless approved by your health care provider.  Take a prenatal vitamin that contains at least 600 micrograms (mcg) of folic acid. Eating and drinking  Eat a healthy diet that includes fresh fruits and vegetables, whole grains, good sources of protein such as meat, eggs, or tofu, and low-fat dairy products.  Avoid raw meat and unpasteurized juice, milk, and cheese. These carry germs that can harm you and your baby.  You may need to take these actions to prevent or treat constipation: ? Drink enough fluid to keep your urine pale yellow. ? Eat foods that are high in fiber, such as beans, whole grains, and fresh fruits and vegetables. ? Limit foods that are high in fat and processed sugars, such as fried or sweet foods. Activity  Exercise only as directed by your health care provider. Most people can continue their usual exercise routine during pregnancy. Try to exercise for 30 minutes at least   5 days a week. Stop exercising if you develop contractions in your uterus.  Stop exercising if you develop pain or cramping in the lower abdomen or lower back.  Avoid exercising if it is very hot or humid or if you are at a high altitude.  Avoid heavy lifting.  If you choose to, you may have sex unless your health care provider tells you not to. Relieving pain and discomfort  Wear a supportive bra to prevent discomfort from  breast tenderness.  Take warm sitz baths to soothe any pain or discomfort caused by hemorrhoids. Use hemorrhoid cream if your health care provider approves.  Rest with your legs raised (elevated) if you have leg cramps or low back pain.  If you develop varicose veins: ? Wear support hose as told by your health care provider. ? Elevate your feet for 15 minutes, 3-4 times a day. ? Limit salt in your diet. Safety  Wear your seat belt at all times when driving or riding in a car.  Talk with your health care provider if someone is verbally or physically abusive to you. Lifestyle  Do not use hot tubs, steam rooms, or saunas.  Do not douche. Do not use tampons or scented sanitary pads.  Avoid cat litter boxes and soil used by cats. These carry germs that can cause birth defects in the baby and possibly loss of the fetus by miscarriage or stillbirth.  Do not use herbal remedies, alcohol, illegal drugs, or medicines that are not approved by your health care provider. Chemicals in these products can harm your baby.  Do not use any products that contain nicotine or tobacco, such as cigarettes, e-cigarettes, and chewing tobacco. If you need help quitting, ask your health care provider. General instructions  During a routine prenatal visit, your health care provider will do a physical exam and other tests. He or she will also discuss your overall health. Keep all follow-up visits. This is important.  Ask your health care provider for a referral to a local prenatal education class.  Ask for help if you have counseling or nutritional needs during pregnancy. Your health care provider can offer advice or refer you to specialists for help with various needs. Where to find more information  American Pregnancy Association: americanpregnancy.org  American College of Obstetricians and Gynecologists: acog.org/en/Womens%20Health/Pregnancy  Office on Women's Health: womenshealth.gov/pregnancy Contact  a health care provider if you have:  A headache that does not go away when you take medicine.  Vision changes or you see spots in front of your eyes.  Mild pelvic cramps, pelvic pressure, or nagging pain in the abdominal area.  Persistent nausea, vomiting, or diarrhea.  A bad-smelling vaginal discharge or foul-smelling urine.  Pain when you urinate.  Sudden or extreme swelling of your face, hands, ankles, feet, or legs.  A fever. Get help right away if you:  Have fluid leaking from your vagina.  Have spotting or bleeding from your vagina.  Have severe abdominal cramping or pain.  Have difficulty breathing.  Have chest pain.  Have fainting spells.  Have not felt your baby move for the time period told by your health care provider.  Have new or increased pain, swelling, or redness in an arm or leg. Summary  The second trimester of pregnancy is from week 13 through week 27 (months 4 through 6).  Do not use herbal remedies, alcohol, illegal drugs, or medicines that are not approved by your health care provider. Chemicals in these products can   harm your baby.  Exercise only as directed by your health care provider. Most people can continue their usual exercise routine during pregnancy.  Keep all follow-up visits. This is important. This information is not intended to replace advice given to you by your health care provider. Make sure you discuss any questions you have with your health care provider. Document Revised: 07/16/2019 Document Reviewed: 05/22/2019 Elsevier Patient Education  2021 Elsevier Inc.  

## 2020-05-07 ENCOUNTER — Other Ambulatory Visit: Payer: Self-pay

## 2020-05-07 ENCOUNTER — Ambulatory Visit (INDEPENDENT_AMBULATORY_CARE_PROVIDER_SITE_OTHER): Payer: Medicaid Other | Admitting: Obstetrics & Gynecology

## 2020-05-07 ENCOUNTER — Other Ambulatory Visit: Payer: Medicaid Other

## 2020-05-07 ENCOUNTER — Encounter: Payer: Self-pay | Admitting: Obstetrics & Gynecology

## 2020-05-07 VITALS — BP 120/80 | Wt 213.0 lb

## 2020-05-07 DIAGNOSIS — Z369 Encounter for antenatal screening, unspecified: Secondary | ICD-10-CM | POA: Diagnosis not present

## 2020-05-07 DIAGNOSIS — Z3483 Encounter for supervision of other normal pregnancy, third trimester: Secondary | ICD-10-CM

## 2020-05-07 DIAGNOSIS — Z3A24 24 weeks gestation of pregnancy: Secondary | ICD-10-CM | POA: Diagnosis not present

## 2020-05-07 DIAGNOSIS — Z131 Encounter for screening for diabetes mellitus: Secondary | ICD-10-CM

## 2020-05-07 DIAGNOSIS — Z3A28 28 weeks gestation of pregnancy: Secondary | ICD-10-CM

## 2020-05-07 DIAGNOSIS — Z3480 Encounter for supervision of other normal pregnancy, unspecified trimester: Secondary | ICD-10-CM

## 2020-05-07 LAB — POCT URINALYSIS DIPSTICK OB
Glucose, UA: NEGATIVE
POC,PROTEIN,UA: NEGATIVE

## 2020-05-07 NOTE — Progress Notes (Signed)
  Subjective  Fetal Movement? yes Contractions? no Leaking Fluid? no Vaginal Bleeding? no Rd lig pain Objective  BP 120/80   Wt 213 lb (96.6 kg)   LMP  (LMP Unknown)   BMI 36.56 kg/m  General: NAD Pumonary: no increased work of breathing Abdomen: gravid, non-tender Extremities: no edema Psychiatric: mood appropriate, affect full  Assessment  35 y.o. G2P1001 at [redacted]w[redacted]d by  07/25/2020, by Ultrasound presenting for routine prenatal visit  Plan   Problem List Items Addressed This Visit      Other   Supervision of other normal pregnancy, antepartum    Other Visit Diagnoses    [redacted] weeks gestation of pregnancy    -  Primary   Screening for diabetes mellitus       Encounter for supervision of other normal pregnancy in third trimester        PNV, FMC, Glucola today, PTL precautions discussed Encouraged maternity belt/ lift for pains of back and round lig  pregnancy 2 Problems (from 12/29/19 to present)    Problem Noted Resolved   Supervision of other normal pregnancy, antepartum 01/02/2020 by Vena Austria, MD No   Overview Addendum 05/07/2020 10:09 AM by Nadara Mustard, MD    [facility]inic [facility] Prenatal Labs  Dating 13 week u/s Blood type: O/Positive/-- (11/08 1603)   Genetic Screen NIPS: diploid XY Antibody:Negative (11/08 1603)  Anatomic [place] Incomplete. 2nd scan 1/22-complete, normal female Rubella: 17.30 (11/08 1603)  Varicella: Immune  GTT Early:               Third trimester:  RPR: Non Reactive (11/08 1603)   Rhogam n/a HBsAg: Negative (11/08 1603)   TDaP vaccine                       Flu Shot: HIV: Non Reactive (11/08 1603)   Baby Food      Breast                          GBS:   Contraception   POP then vasec; BTL if CS needed (consent [ ] ) pp  CBB  No   CS/VBAC N/A   Support Person Husband          UDJ:SHFWY, MD, Annamarie Major Ob/Gyn, [facility] 05/07/2020  10:19 AM

## 2020-05-07 NOTE — Patient Instructions (Signed)

## 2020-05-07 NOTE — Addendum Note (Signed)
Addended by: Cornelius Moras D on: 05/07/2020 10:23 AM   Modules accepted: Orders

## 2020-05-08 LAB — 28 WEEK RH+PANEL
Basophils Absolute: 0.1 10*3/uL (ref 0.0–0.2)
Basos: 0 %
EOS (ABSOLUTE): 0.2 10*3/uL (ref 0.0–0.4)
Eos: 1 %
Gestational Diabetes Screen: 111 mg/dL (ref 65–139)
HIV Screen 4th Generation wRfx: NONREACTIVE
Hematocrit: 35.9 % (ref 34.0–46.6)
Hemoglobin: 12.3 g/dL (ref 11.1–15.9)
Immature Grans (Abs): 0.1 10*3/uL (ref 0.0–0.1)
Immature Granulocytes: 1 %
Lymphocytes Absolute: 3 10*3/uL (ref 0.7–3.1)
Lymphs: 18 %
MCH: 32.6 pg (ref 26.6–33.0)
MCHC: 34.3 g/dL (ref 31.5–35.7)
MCV: 95 fL (ref 79–97)
Monocytes Absolute: 0.7 10*3/uL (ref 0.1–0.9)
Monocytes: 4 %
Neutrophils Absolute: 12.4 10*3/uL — ABNORMAL HIGH (ref 1.4–7.0)
Neutrophils: 76 %
Platelets: 344 10*3/uL (ref 150–450)
RBC: 3.77 x10E6/uL (ref 3.77–5.28)
RDW: 12.3 % (ref 11.7–15.4)
RPR Ser Ql: NONREACTIVE
WBC: 16.4 10*3/uL — ABNORMAL HIGH (ref 3.4–10.8)

## 2020-05-21 ENCOUNTER — Ambulatory Visit (INDEPENDENT_AMBULATORY_CARE_PROVIDER_SITE_OTHER): Payer: Medicaid Other | Admitting: Obstetrics and Gynecology

## 2020-05-21 ENCOUNTER — Other Ambulatory Visit: Payer: Self-pay

## 2020-05-21 VITALS — BP 118/68 | Wt 213.0 lb

## 2020-05-21 DIAGNOSIS — Z3480 Encounter for supervision of other normal pregnancy, unspecified trimester: Secondary | ICD-10-CM

## 2020-05-21 DIAGNOSIS — Z3A30 30 weeks gestation of pregnancy: Secondary | ICD-10-CM

## 2020-05-21 NOTE — Progress Notes (Signed)
    Routine Prenatal Care Visit  Subjective  Megan Bell is a 35 y.o. G2P1001 at [redacted]w[redacted]d being seen today for ongoing prenatal care.  She is currently monitored for the following issues for this low-risk pregnancy and has Supervision of other normal pregnancy, antepartum on their problem list.  ----------------------------------------------------------------------------------- Patient reports nausea.   Contractions: Not present. Vag. Bleeding: None.  Movement: Present. Denies leaking of fluid.  ----------------------------------------------------------------------------------- The following portions of the patient's history were reviewed and updated as appropriate: allergies, current medications, past family history, past medical history, past social history, past surgical history and problem list. Problem list updated.   Objective  Blood pressure 118/68, weight 213 lb (96.6 kg). Pregravid weight 205 lb (93 kg) Total Weight Gain 8 lb (3.629 kg) Urinalysis:      Fetal Status: Fetal Heart Rate (bpm): 135 Fundal Height: 30 cm Movement: Present     General:  Alert, oriented and cooperative. Patient is in no acute distress.  Skin: Skin is warm and dry. No rash noted.   Cardiovascular: Normal heart rate noted  Respiratory: Normal respiratory effort, no problems with respiration noted  Abdomen: Soft, gravid, appropriate for gestational age. Pain/Pressure: Absent     Pelvic:  Cervical exam deferred        Extremities: Normal range of motion.     ental Status: Normal mood and affect. Normal behavior. Normal judgment and thought content.     Assessment   35 y.o. G2P1001 at [redacted]w[redacted]d by  07/25/2020, by Ultrasound presenting for routine prenatal visit  Plan   pregnancy 2 Problems (from 12/29/19 to present)    Problem Noted Resolved   Supervision of other normal pregnancy, antepartum 01/02/2020 by Vena Austria, MD No   Overview Addendum 05/07/2020 10:09 AM by Nadara Mustard, MD     [facility]inic [facility] Prenatal Labs  Dating 13 week u/s Blood type: O/Positive/-- (11/08 1603)   Genetic Screen NIPS: diploid XY Antibody:Negative (11/08 1603)  Anatomic [place] Incomplete. 2nd scan 1/22-complete, normal female Rubella: 17.30 (11/08 1603)  Varicella: Immune  GTT Early:               Third trimester:  RPR: Non Reactive (11/08 1603)   Rhogam n/a HBsAg: Negative (11/08 1603)   TDaP vaccine                       Flu Shot: HIV: Non Reactive (11/08 1603)   Baby Food      Breast                          GBS:   Contraception   POP then vasec; BTL if CS needed (consent [ ] ) pp  CBB  No   CS/VBAC N/A   Support Person Husband           Previous Version       Gestational age appropriate obstetric precautions including but not limited to vaginal bleeding, contractions, leaking of fluid and fetal movement were reviewed in detail with the patient.    Return in about 2 weeks (around 06/04/2020) for ROB.  06/06/2020, MD, Vena Austria [facility], [facility] 05/21/2020, 11:36 AM

## 2020-06-03 ENCOUNTER — Ambulatory Visit (INDEPENDENT_AMBULATORY_CARE_PROVIDER_SITE_OTHER): Payer: Medicaid Other | Admitting: Obstetrics and Gynecology

## 2020-06-03 ENCOUNTER — Encounter: Payer: Self-pay | Admitting: Obstetrics and Gynecology

## 2020-06-03 ENCOUNTER — Other Ambulatory Visit: Payer: Self-pay

## 2020-06-03 VITALS — BP 100/60 | Wt 212.0 lb

## 2020-06-03 DIAGNOSIS — Z3483 Encounter for supervision of other normal pregnancy, third trimester: Secondary | ICD-10-CM | POA: Diagnosis not present

## 2020-06-03 DIAGNOSIS — Z23 Encounter for immunization: Secondary | ICD-10-CM | POA: Diagnosis not present

## 2020-06-03 DIAGNOSIS — Z3A32 32 weeks gestation of pregnancy: Secondary | ICD-10-CM

## 2020-06-03 LAB — POCT URINALYSIS DIPSTICK OB
Glucose, UA: NEGATIVE
POC,PROTEIN,UA: NEGATIVE

## 2020-06-03 NOTE — Progress Notes (Signed)
  Routine Prenatal Care Visit  Subjective  Megan Bell is a 35 y.o. G2P1001 at [redacted]w[redacted]d being seen today for ongoing prenatal care.  She is currently monitored for the following issues for this low-risk pregnancy and has Supervision of other normal pregnancy, antepartum on their problem list.  ----------------------------------------------------------------------------------- Patient reports no complaints.   Contractions: Not present. Vag. Bleeding: None.  Movement: Present. Leaking Fluid denies.  ----------------------------------------------------------------------------------- The following portions of the patient's history were reviewed and updated as appropriate: allergies, current medications, past family history, past medical history, past social history, past surgical history and problem list. Problem list updated.  Objective  Blood pressure 100/60, weight 212 lb (96.2 kg). Pregravid weight 205 lb (93 kg) Total Weight Gain 7 lb (3.175 kg) Urinalysis: Urine Protein Negative  Urine Glucose Negative  Fetal Status: Fetal Heart Rate (bpm): 145 Fundal Height: 32 cm Movement: Present     General:  Alert, oriented and cooperative. Patient is in no acute distress.  Skin: Skin is warm and dry. No rash noted.   Cardiovascular: Normal heart rate noted  Respiratory: Normal respiratory effort, no problems with respiration noted  Abdomen: Soft, gravid, appropriate for gestational age. Pain/Pressure: Absent     Pelvic:  Cervical exam deferred        Extremities: Normal range of motion.  Edema: None  Mental Status: Normal mood and affect. Normal behavior. Normal judgment and thought content.   Assessment   35 y.o. G2P1001 at [redacted]w[redacted]d by  07/25/2020, by Ultrasound presenting for routine prenatal visit  Plan   pregnancy 2 Problems (from 12/29/19 to present)    Problem Noted Resolved   Supervision of other normal pregnancy, antepartum 01/02/2020 by Vena Austria, MD No   Overview Addendum  06/03/2020 10:01 AM by Conard Novak, MD    [facility]inic [facility] Prenatal Labs  Dating 13 week u/s Blood type: O/Positive/-- (11/08 1603)   Genetic Screen NIPS: diploid XY Antibody:Negative (11/08 1603)  Anatomic [place] Incomplete. 2nd scan 1/22-complete, normal female Rubella: 17.30 (11/08 1603)  Varicella: Immune  GTT Early: not done               Third trimester: 111 RPR: Non Reactive (11/08 1603)   Rhogam n/a HBsAg: Negative (11/08 1603)   TDaP vaccine    4/14      Flu Shot: 9/25 HIV: Non Reactive (11/08 1603)   Baby Food      Breast                          GBS:   Contraception   POP then vasec; BTL if CS needed (consent 4/14]) EQA:STMHD pp  CBB  No   CS/VBAC N/A   Support Person Husband           Previous Version       Preterm labor symptoms and general obstetric precautions including but not limited to vaginal bleeding, contractions, leaking of fluid and fetal movement were reviewed in detail with the patient. Please refer to After Visit Summary for other counseling recommendations.   - TDaP today - signed MCD 30 day papers in case c-section (wants BTL if c-section)  Return in about 2 weeks (around 06/17/2020) for Routine Prenatal Appointment.   Thomasene Mohair, MD, [facility], [facility] 06/03/2020 10:02 AM

## 2020-06-18 ENCOUNTER — Ambulatory Visit (INDEPENDENT_AMBULATORY_CARE_PROVIDER_SITE_OTHER): Payer: Medicaid Other | Admitting: Advanced Practice Midwife

## 2020-06-18 ENCOUNTER — Encounter: Payer: Self-pay | Admitting: Advanced Practice Midwife

## 2020-06-18 ENCOUNTER — Other Ambulatory Visit: Payer: Self-pay

## 2020-06-18 VITALS — BP 100/70 | Wt 214.0 lb

## 2020-06-18 DIAGNOSIS — Z3A34 34 weeks gestation of pregnancy: Secondary | ICD-10-CM

## 2020-06-18 DIAGNOSIS — Z3483 Encounter for supervision of other normal pregnancy, third trimester: Secondary | ICD-10-CM

## 2020-06-18 LAB — POCT URINALYSIS DIPSTICK OB: Glucose, UA: NEGATIVE

## 2020-06-18 NOTE — Addendum Note (Signed)
Addended by: Donnetta Hail on: 06/18/2020 10:43 AM   Modules accepted: Orders

## 2020-06-18 NOTE — Progress Notes (Signed)
  Routine Prenatal Care Visit  Subjective  Megan Bell is a 35 y.o. G2P1001 at [redacted]w[redacted]d being seen today for ongoing prenatal care.  She is currently monitored for the following issues for this low-risk pregnancy and has Supervision of other normal pregnancy, antepartum on their problem list.  ----------------------------------------------------------------------------------- Patient reports several current social stressors including moving in the next month.   Contractions: Not present. Vag. Bleeding: None.  Movement: Present. Leaking Fluid denies.  ----------------------------------------------------------------------------------- The following portions of the patient's history were reviewed and updated as appropriate: allergies, current medications, past family history, past medical history, past social history, past surgical history and problem list. Problem list updated.  Objective  Blood pressure 100/70, weight 214 lb (97.1 kg). Pregravid weight 205 lb (93 kg) Total Weight Gain 9 lb (4.082 kg) Urinalysis: Urine Protein    Urine Glucose    Fetal Status: Fetal Heart Rate (bpm): 133 Fundal Height: 34 cm Movement: Present     General:  Alert, oriented and cooperative. Patient is in no acute distress.  Skin: Skin is warm and dry. No rash noted.   Cardiovascular: Normal heart rate noted  Respiratory: Normal respiratory effort, no problems with respiration noted  Abdomen: Soft, gravid, appropriate for gestational age. Pain/Pressure: Absent     Pelvic:  Cervical exam deferred        Extremities: Normal range of motion.     Mental Status: Normal mood and affect. Normal behavior. Normal judgment and thought content.   Assessment   35 y.o. G2P1001 at [redacted]w[redacted]d by  07/25/2020, by Ultrasound presenting for routine prenatal visit  Plan   pregnancy 2 Problems (from 12/29/19 to present)    Problem Noted Resolved   Supervision of other normal pregnancy, antepartum 01/02/2020 by Vena Austria,  MD No   Overview Addendum 06/03/2020 10:01 AM by Conard Novak, MD    Clinic [facility] Prenatal Labs  Dating 13 week u/s Blood type: O/Positive/-- (11/08 1603)   Genetic Screen NIPS: diploid XY Antibody:Negative (11/08 1603)  Anatomic [place] Incomplete. 2nd scan 1/22-complete, normal female Rubella: 17.30 (11/08 1603)  Varicella: Immune  GTT Early: not done               Third trimester: 111 RPR: Non Reactive (11/08 1603)   Rhogam n/a HBsAg: Negative (11/08 1603)   TDaP vaccine    4/14      Flu Shot: 9/25 HIV: Non Reactive (11/08 1603)   Baby Food      Breast                          GBS:   Contraception   POP then vasec; BTL if CS needed (consent 4/14]) [id number] pp  CBB  No   CS/VBAC N/A   Support Person Husband           Previous Version       Preterm labor symptoms and general obstetric precautions including but not limited to vaginal bleeding, contractions, leaking of fluid and fetal movement were reviewed in detail with the patient.    Return in about 2 weeks (around 07/02/2020) for rob.  Tresea Mall, CNM 06/18/2020 10:38 AM

## 2020-06-18 NOTE — Progress Notes (Signed)
ROB- no concerns 

## 2020-06-29 ENCOUNTER — Telehealth: Payer: Self-pay

## 2020-06-29 NOTE — Telephone Encounter (Signed)
Annice Pih from [facility] calling to confirm provider signature of AMS; ref# [id number].  PH# [phone]

## 2020-07-02 ENCOUNTER — Ambulatory Visit (INDEPENDENT_AMBULATORY_CARE_PROVIDER_SITE_OTHER): Payer: Medicaid Other | Admitting: Obstetrics and Gynecology

## 2020-07-02 ENCOUNTER — Other Ambulatory Visit (HOSPITAL_COMMUNITY)
Admission: RE | Admit: 2020-07-02 | Discharge: 2020-07-02 | Disposition: A | Discharge status: Home / Self Care | Payer: Medicaid Other | Source: Ambulatory Visit | Attending: Obstetrics and Gynecology | Admitting: Obstetrics and Gynecology

## 2020-07-02 ENCOUNTER — Encounter: Payer: Self-pay | Admitting: Obstetrics and Gynecology

## 2020-07-02 ENCOUNTER — Other Ambulatory Visit: Payer: Self-pay

## 2020-07-02 VITALS — BP 110/60 | Wt 217.0 lb

## 2020-07-02 DIAGNOSIS — Z113 Encounter for screening for infections with a predominantly sexual mode of transmission: Secondary | ICD-10-CM | POA: Insufficient documentation

## 2020-07-02 DIAGNOSIS — Z3685 Encounter for antenatal screening for Streptococcus B: Secondary | ICD-10-CM | POA: Diagnosis not present

## 2020-07-02 DIAGNOSIS — Z3483 Encounter for supervision of other normal pregnancy, third trimester: Secondary | ICD-10-CM

## 2020-07-02 DIAGNOSIS — Z3A36 36 weeks gestation of pregnancy: Secondary | ICD-10-CM

## 2020-07-02 LAB — POCT URINALYSIS DIPSTICK OB
Glucose, UA: NEGATIVE
POC,PROTEIN,UA: NEGATIVE

## 2020-07-02 LAB — OB RESULTS CONSOLE GC/CHLAMYDIA: Gonorrhea: NEGATIVE

## 2020-07-02 NOTE — Progress Notes (Signed)
    Routine Prenatal Care Visit  Subjective  Megan Bell is a 35 y.o. G2P1001 at [redacted]w[redacted]d being seen today for ongoing prenatal care.  She is currently monitored for the following issues for this low-risk pregnancy and has Supervision of other normal pregnancy, antepartum on their problem list.  ----------------------------------------------------------------------------------- Patient reports increased pelvic pressure, especially associated with fetal movement..   Contractions: Not present. Vag. Bleeding: None.  Movement: Present. Denies leaking of fluid.  ----------------------------------------------------------------------------------- The following portions of the patient's history were reviewed and updated as appropriate: allergies, current medications, past family history, past medical history, past social history, past surgical history and problem list. Problem list updated.   Objective  Blood pressure 110/60, weight 217 lb (98.4 kg). Pregravid weight 205 lb (93 kg) Total Weight Gain 12 lb (5.443 kg) Urinalysis:      Fetal Status: Fetal Heart Rate (bpm): 137 Fundal Height: 36 cm Movement: Present  Presentation: Vertex  General:  Alert, oriented and cooperative. Patient is in no acute distress.  Skin: Skin is warm and dry. No rash noted.   Cardiovascular: Normal heart rate noted  Respiratory: Normal respiratory effort, no problems with respiration noted  Abdomen: Soft, gravid, appropriate for gestational age. Pain/Pressure: Present     Pelvic:  Cervical exam performed per patient request Dilation: 1 Effacement (%): 30 Station: -3  Extremities: Normal range of motion.  Edema: Mild pitting, slight indentation  ental Status: Normal mood and affect. Normal behavior. Normal judgment and thought content.     Assessment   35 y.o. G2P1001 at [redacted]w[redacted]d by  07/25/2020, by Ultrasound presenting for routine prenatal visit  Plan   pregnancy 2 Problems (from 12/29/19 to present)    Problem  Noted Resolved   Supervision of other normal pregnancy, antepartum 01/02/2020 by Vena Austria, MD No   Overview Addendum 06/03/2020 10:01 AM by Conard Novak, MD    [facility]inic [facility] Prenatal Labs  Dating 13 week u/s Blood type: O/Positive/-- (11/08 1603)   Genetic Screen NIPS: diploid XY Antibody:Negative (11/08 1603)  Anatomic [place] Incomplete. 2nd scan 1/22-complete, normal female Rubella: 17.30 (11/08 1603)  Varicella: Immune  GTT Early: not done               Third trimester: 111 RPR: Non Reactive (11/08 1603)   Rhogam n/a HBsAg: Negative (11/08 1603)   TDaP vaccine    4/14      Flu Shot: 9/25 HIV: Non Reactive (11/08 1603)   Baby Food      Breast                          GBS:   Contraception   POP then vasec; BTL if CS needed (consent 4/14]) [id number] pp  CBB  No   CS/VBAC N/A   Support Person Husband           Previous Version      -GBS/aptima collected today  Preterm labor precautions including but not limited to vaginal bleeding, contractions, leaking of fluid and fetal movement were reviewed in detail with the patient.    Return in about 1 week (around 07/09/2020) for ROB.  Zipporah Plants, CNM, MSN [facility], [facility] 07/02/2020, 9:31 AM

## 2020-07-02 NOTE — Addendum Note (Signed)
Addended by: Donnetta Hail on: 07/02/2020 09:41 AM   Modules accepted: Orders

## 2020-07-02 NOTE — Telephone Encounter (Signed)
[facility] to confirm pt is a current pt here; to confirm AMS is provider that signed for [facility].  Ref# [id number]   [phone]  Info given; verbal order given and they are refaxing form.

## 2020-07-04 LAB — STREP GP B NAA: Strep Gp B NAA: NEGATIVE

## 2020-07-04 LAB — CERVICOVAGINAL ANCILLARY ONLY
Chlamydia: NEGATIVE
Comment: NEGATIVE
Comment: NORMAL
Neisseria Gonorrhea: NEGATIVE

## 2020-07-09 ENCOUNTER — Other Ambulatory Visit: Payer: Self-pay

## 2020-07-09 ENCOUNTER — Ambulatory Visit (INDEPENDENT_AMBULATORY_CARE_PROVIDER_SITE_OTHER): Payer: Medicaid Other | Admitting: Obstetrics

## 2020-07-09 VITALS — BP 120/78 | Wt 218.0 lb

## 2020-07-09 DIAGNOSIS — Z3A37 37 weeks gestation of pregnancy: Secondary | ICD-10-CM

## 2020-07-09 DIAGNOSIS — Z3483 Encounter for supervision of other normal pregnancy, third trimester: Secondary | ICD-10-CM

## 2020-07-09 DIAGNOSIS — Z3480 Encounter for supervision of other normal pregnancy, unspecified trimester: Secondary | ICD-10-CM

## 2020-07-09 LAB — POCT URINALYSIS DIPSTICK OB: Glucose, UA: NEGATIVE

## 2020-07-09 NOTE — Progress Notes (Signed)
  Routine Prenatal Care Visit  Subjective  Megan Bell is a 35 y.o. G2P1001 at [redacted]w[redacted]d being seen today for ongoing prenatal care.  She is currently monitored for the following issues for this low-risk pregnancy and has Supervision of other normal pregnancy, antepartum on their problem list.  ----------------------------------------------------------------------------------- Patient reports no bleeding, no leaking, occasional contractions and and she has a lot of pelvic pressure.   Contractions: Irregular. Vag. Bleeding: None.  Movement: Present. Leaking Fluid denies.  ----------------------------------------------------------------------------------- The following portions of the patient's history were reviewed and updated as appropriate: allergies, current medications, past family history, past medical history, past social history, past surgical history and problem list. Problem list updated.  Objective  Blood pressure 120/78, weight 218 lb (98.9 kg). Pregravid weight 205 lb (93 kg) Total Weight Gain 13 lb (5.897 kg) Urinalysis: Urine Protein Trace  Urine Glucose Negative  Fetal Status:     Movement: Present     General:  Alert, oriented and cooperative. Patient is in no acute distress.  Skin: Skin is warm and dry. No rash noted.   Cardiovascular: Normal heart rate noted  Respiratory: Normal respiratory effort, no problems with respiration noted  Abdomen: Soft, gravid, appropriate for gestational age. Pain/Pressure: Absent     Pelvic:  Cervical exam performed      2 cms/30%/-3. Cervix is softening and is midway.  Extremities: Normal range of motion.     Mental Status: Normal mood and affect. Normal behavior. Normal judgment and thought content.   Assessment   35 y.o. G2P1001 at [redacted]w[redacted]d by  07/25/2020, by Ultrasound presenting for routine prenatal visit  Plan   pregnancy 2 Problems (from 12/29/19 to present)    Problem Noted Resolved   Supervision of other normal pregnancy,  antepartum 01/02/2020 by Vena Austria, MD No   Overview Addendum 07/09/2020  9:22 AM by Mirna [name], CNM    [facility]inic [facility] Prenatal Labs  Dating 13 week u/s Blood type: O/Positive/-- (11/08 1603)   Genetic Screen NIPS: diploid XY Antibody:Negative (11/08 1603)  Anatomic [place] Incomplete. 2nd scan 1/22-complete, normal female Rubella: 17.30 (11/08 1603)  Varicella: Immune  GTT Early: not done               Third trimester: 111 RPR: Non Reactive (11/08 1603)   Rhogam n/a HBsAg: Negative (11/08 1603)   TDaP vaccine    4/14      Flu Shot: 9/25 HIV: Non Reactive (11/08 1603)   Baby Food      Breast                          negtive  Contraception   POP then vasec; BTL if CS needed (consent 4/14]) URK:YHCWC pp  CBB  No   CS/VBAC N/A   Support Person Husband           Previous Version       Term labor symptoms and general obstetric precautions including but not limited to vaginal bleeding, contractions, leaking of fluid and fetal movement were reviewed in detail with the patient. Please refer to After Visit Summary for other counseling recommendations.   Return in about 1 week (around 07/16/2020) for return OB.  Mirna [name], CNM  07/09/2020 9:22 AM

## 2020-07-09 NOTE — Progress Notes (Signed)
Pelvic pressure. Irregular contractions. Desires cervix check today.

## 2020-07-16 ENCOUNTER — Other Ambulatory Visit: Payer: Self-pay

## 2020-07-16 ENCOUNTER — Ambulatory Visit (INDEPENDENT_AMBULATORY_CARE_PROVIDER_SITE_OTHER): Payer: Medicaid Other | Admitting: Obstetrics

## 2020-07-16 VITALS — BP 128/72 | Wt 221.1 lb

## 2020-07-16 DIAGNOSIS — Z3A38 38 weeks gestation of pregnancy: Secondary | ICD-10-CM

## 2020-07-16 DIAGNOSIS — Z3480 Encounter for supervision of other normal pregnancy, unspecified trimester: Secondary | ICD-10-CM

## 2020-07-16 LAB — POCT URINALYSIS DIPSTICK OB
Glucose, UA: NEGATIVE
POC,PROTEIN,UA: NEGATIVE

## 2020-07-16 NOTE — Addendum Note (Signed)
Addended by: Clement Husbands A on: 07/16/2020 03:35 PM   Modules accepted: Orders

## 2020-07-16 NOTE — Progress Notes (Signed)
  Routine Prenatal Care Visit  Subjective  Megan Bell is a 35 y.o. G2P1001 at [redacted]w[redacted]d being seen today for ongoing prenatal care.  She is currently monitored for the following issues for this low-risk pregnancy and has Supervision of other normal pregnancy, antepartum on their problem list.  ----------------------------------------------------------------------------------- Patient reports no complaints.   Contractions: Irregular. Vag. Bleeding: None.  Movement: Present. Leaking Fluid denies.  ----------------------------------------------------------------------------------- The following portions of the patient's history were reviewed and updated as appropriate: allergies, current medications, past family history, past medical history, past social history, past surgical history and problem list. Problem list updated.  Objective  Blood pressure 128/72, weight 221 lb 1.6 oz (100.3 kg). Pregravid weight 205 lb (93 kg) Total Weight Gain 16 lb 1.6 oz (7.303 kg) Urinalysis: Urine Protein    Urine Glucose    Fetal Status:     Movement: Present     General:  Alert, oriented and cooperative. Patient is in no acute distress.  Skin: Skin is warm and dry. No rash noted.   Cardiovascular: Normal heart rate noted  Respiratory: Normal respiratory effort, no problems with respiration noted  Abdomen: Soft, gravid, appropriate for gestational age. Pain/Pressure: Absent     Pelvic:  Cervical exam performed      posterior cervix is soft,2-3 cms/40%/-3  Extremities: Normal range of motion.     Mental Status: Normal mood and affect. Normal behavior. Normal judgment and thought content.   Assessment   35 y.o. G2P1001 at [redacted]w[redacted]d by  07/25/2020, by Ultrasound presenting for routine prenatal visit  Plan   pregnancy 2 Problems (from 12/29/19 to present)    Problem Noted Resolved   Supervision of other normal pregnancy, antepartum 01/02/2020 by Vena Austria, MD No   Overview Addendum 07/09/2020  9:22 AM  by Mirna [name], CNM    [facility]inic [facility] Prenatal Labs  Dating 13 week u/s Blood type: O/Positive/-- (11/08 1603)   Genetic Screen NIPS: diploid XY Antibody:Negative (11/08 1603)  Anatomic [place] Incomplete. 2nd scan 1/22-complete, normal female Rubella: 17.30 (11/08 1603)  Varicella: Immune  GTT Early: not done               Third trimester: 111 RPR: Non Reactive (11/08 1603)   Rhogam n/a HBsAg: Negative (11/08 1603)   TDaP vaccine    4/14      Flu Shot: 9/25 HIV: Non Reactive (11/08 1603)   Baby Food      Breast                          negtive  Contraception   POP then vasec; BTL if CS needed (consent 4/14]) [id number] pp  CBB  No   CS/VBAC N/A   Support Person Husband           Previous Version       Term labor symptoms and general obstetric precautions including but not limited to vaginal bleeding, contractions, leaking of fluid and fetal movement were reviewed in detail with the patient. Please refer to After Visit Summary for other counseling recommendations.   No follow-ups on file.  Mirna [name], CNM  07/16/2020 10:23 AM

## 2020-07-23 ENCOUNTER — Ambulatory Visit (INDEPENDENT_AMBULATORY_CARE_PROVIDER_SITE_OTHER): Payer: Medicaid Other | Admitting: Advanced Practice Midwife

## 2020-07-23 ENCOUNTER — Other Ambulatory Visit: Payer: Self-pay

## 2020-07-23 ENCOUNTER — Encounter: Payer: Self-pay | Admitting: Advanced Practice Midwife

## 2020-07-23 VITALS — BP 120/80 | Wt 219.0 lb

## 2020-07-23 DIAGNOSIS — Z3A39 39 weeks gestation of pregnancy: Secondary | ICD-10-CM

## 2020-07-23 DIAGNOSIS — Z3483 Encounter for supervision of other normal pregnancy, third trimester: Secondary | ICD-10-CM

## 2020-07-23 DIAGNOSIS — O99213 Obesity complicating pregnancy, third trimester: Secondary | ICD-10-CM

## 2020-07-23 DIAGNOSIS — O99210 Obesity complicating pregnancy, unspecified trimester: Secondary | ICD-10-CM | POA: Insufficient documentation

## 2020-07-23 NOTE — Progress Notes (Signed)
  Routine Prenatal Care Visit  Subjective  Megan Bell is a 35 y.o. G2P1001 at [redacted]w[redacted]d being seen today for ongoing prenatal care.  She is currently monitored for the following issues for this low-risk pregnancy and has Supervision of other normal pregnancy, antepartum on their problem list.  ----------------------------------------------------------------------------------- Patient reports no complaints.   Contractions: Irregular. Vag. Bleeding: None.  Movement: Present. Leaking Fluid denies.  ----------------------------------------------------------------------------------- The following portions of the patient's history were reviewed and updated as appropriate: allergies, current medications, past family history, past medical history, past social history, past surgical history and problem list. Problem list updated.  Objective  Blood pressure 120/80, weight 219 lb (99.3 kg). Pregravid weight 205 lb (93 kg) Total Weight Gain 14 lb (6.35 kg) Urinalysis: Urine Protein    Urine Glucose    Fetal Status: Fetal Heart Rate (bpm): 133 Fundal Height: 38 cm Movement: Present  Presentation: Vertex  General:  Alert, oriented and cooperative. Patient is in no acute distress.  Skin: Skin is warm and dry. No rash noted.   Cardiovascular: Normal heart rate noted  Respiratory: Normal respiratory effort, no problems with respiration noted  Abdomen: Soft, gravid, appropriate for gestational age. Pain/Pressure: Present     Pelvic:  cervix swept Dilation: 2.5 Effacement (%): 60 Station: -2  Extremities: Normal range of motion.  Edema: None  Mental Status: Normal mood and affect. Normal behavior. Normal judgment and thought content.   Assessment   35 y.o. G2P1001 at [redacted]w[redacted]d by  07/25/2020, by Ultrasound presenting for routine prenatal visit  Plan   pregnancy 2 Problems (from 12/29/19 to present)    Problem Noted Resolved   Supervision of other normal pregnancy, antepartum 01/02/2020 by Vena Austria, MD No   Overview Addendum 07/09/2020  9:22 AM by Mirna [name], CNM    [facility]inic [facility] Prenatal Labs  Dating 13 week u/s Blood type: O/Positive/-- (11/08 1603)   Genetic Screen NIPS: diploid XY Antibody:Negative (11/08 1603)  Anatomic [place] Incomplete. 2nd scan 1/22-complete, normal female Rubella: 17.30 (11/08 1603)  Varicella: Immune  GTT Early: not done               Third trimester: 111 RPR: Non Reactive (11/08 1603)   Rhogam n/a HBsAg: Negative (11/08 1603)   TDaP vaccine    4/14      Flu Shot: 9/25 HIV: Non Reactive (11/08 1603)   Baby Food      Breast                          negtive  Contraception   POP then vasec; BTL if CS needed (consent 4/14]) HAL:PFXTK pp  CBB  No   CS/VBAC N/A   Support Person Husband           Previous Version       Term labor symptoms and general obstetric precautions including but not limited to vaginal bleeding, contractions, leaking of fluid and fetal movement were reviewed in detail with the patient.   Return in about 1 week (around 07/30/2020) for nst/rob.  Tresea Mall, CNM 07/23/2020 9:47 AM

## 2020-07-23 NOTE — Addendum Note (Signed)
Addended by: Tresea Mall on: 07/23/2020 01:28 PM   Modules accepted: Orders, SmartSet

## 2020-07-23 NOTE — H&P (Addendum)
OB History & Physical   History of Present Illness:  Chief Complaint: 41 week IOL  HPI:  Megan Bell is a 35 y.o. G65P1001 female at [redacted]w[redacted]d dated by 13 week ultrasound.  Her pregnancy has been complicated by obesity BMI 37 .    She reports irregular contractions.   She denies leakage of fluid.   She denies vaginal bleeding.   She reports fetal movement.    Total weight gain for pregnancy: 14 lb (6.35 kg)   Obstetrical Problem List: pregnancy 2 Problems (from 12/29/19 to present)     Problem Noted Resolved   Supervision of other normal pregnancy, antepartum 01/02/2020 by Vena Austria, MD No   Overview Addendum 07/09/2020  9:22 AM by Mirna [name], CNM    [facility]inic [facility] Prenatal Labs  Dating 13 week u/s Blood type: O/Positive/-- (11/08 1603)   Genetic Screen NIPS: diploid XY Antibody:Negative (11/08 1603)  Anatomic [place] Incomplete. 2nd scan 1/22-complete, normal female Rubella: 17.30 (11/08 1603)  Varicella: Immune  GTT Early: not done               Third trimester: 111 RPR: Non Reactive (11/08 1603)   Rhogam n/a HBsAg: Negative (11/08 1603)   TDaP vaccine    4/14      Flu Shot: 9/25 HIV: Non Reactive (11/08 1603)   Baby Food      Breast                          negtive  Contraception   POP then vasec; BTL if CS needed (consent 4/14]) [id number]:[id number] pp  CBB  No   CS/VBAC N/A   Support Person Husband           Previous Version        Maternal Medical History:   Past Medical History:  Diagnosis Date   Abnormal Pap smear of cervix 2011   didn't f/u   Endometriosis 2011   of ovarian cyst during preg on u/s 2011    Past Surgical History:  Procedure Laterality Date   LAPAROSCOPIC CHOLECYSTECTOMY  2011   Dr. Evette Cristal    No Known Allergies  Prior to Admission medications   Medication Sig Start Date End Date Taking? Authorizing Provider  Cetirizine-Pseudoephedrine (ZYRTEC-D PO) Take by mouth.    [provider]  ondansetron (ZOFRAN-ODT) 8 MG disintegrating  tablet Take 8 mg by mouth every 8 (eight) hours as needed. 05/24/20   [provider]  Prenatal Vit-Fe Fumarate-FA (MULTIVITAMIN-PRENATAL) 27-0.8 MG TABS tablet Take 1 tablet by mouth daily at 12 noon.    [provider]    OB History  Gravida Para Term Preterm AB Living  2 1 1     1   SAB IAB Ectopic Multiple Live Births          1    # Outcome Date GA Lbr Len/2nd Weight Sex Delivery Anes PTL Lv  2 Current           1 Term 03/22/09 [redacted]w[redacted]d  6 lb 3 oz (2.807 kg) F Vag-Spont   LIV     Birth Comments: deceled once or twice during labor but baby okay    Prenatal care site: [facility]  Social History: She  reports that she has been smoking cigarettes and e-cigarettes. She has been smoking about 0.50 packs per day. She has never used smokeless tobacco. She reports current alcohol use. She reports that she does not use drugs.  Family History: family history includes Breast cancer (age of onset: 90) in her paternal grandmother; Heart disease in her maternal grandfather.    Review of Systems:  Review of Systems  Constitutional: Negative for chills and fever.  HENT: Negative for congestion, ear discharge, ear pain, hearing loss, sinus pain and sore throat.   Eyes: Negative for blurred vision and double vision.  Respiratory: Negative for cough, shortness of breath and wheezing.   Cardiovascular: Negative for chest pain, palpitations and leg swelling.  Gastrointestinal: Negative for abdominal pain, blood in stool, constipation, diarrhea, heartburn, melena, nausea and vomiting.  Genitourinary: Negative for dysuria, flank pain, frequency, hematuria and urgency.  Musculoskeletal: Negative for back pain, joint pain and myalgias.  Skin: Negative for itching and rash.  Neurological: Negative for dizziness, tingling, tremors, sensory change, speech change, focal weakness, seizures, loss of consciousness, weakness and headaches.  Endo/Heme/Allergies: Negative for environmental  allergies. Does not bruise/bleed easily.  Psychiatric/Behavioral: Negative for depression, hallucinations, memory loss, substance abuse and suicidal ideas. The patient is not nervous/anxious and does not have insomnia.      Physical Exam:  BP 120/80   Wt 219 lb (99.3 kg)   LMP  (LMP Unknown)   BMI 37.59 kg/m   Constitutional: Well nourished, well developed female in no acute distress.  HEENT: normal Skin: Warm and dry.  Cardiovascular: Regular rate and rhythm.   Extremity:  no edema   Respiratory: Clear to auscultation bilateral. Normal respiratory effort Abdomen: FHT present Back: no CVAT Neuro: DTRs 2+, Cranial nerves grossly intact Psych: Alert and Oriented x3. No memory deficits. Normal mood and affect.  MS: normal gait, normal bilateral lower extremity ROM/strength/stability.  Pelvic exam:  is not limited by body habitus EGBUS: within normal limits Vagina: within normal limits and with normal mucosa  Cervix: 2.5/60/-2   Covid test result pending  No results found for: SARSCOV2NAA]  Assessment:  Megan Bell is a 35 y.o. G81P1001 female at [redacted]w[redacted]d with induction of labor at 41 weeks.   Plan:  Admit to Labor & Delivery  CBC, T&S, Clrs, IVF GBS negative.   Fetal well-being: reassuring Begin induction based on admission exam    Tresea Mall, CNM 07/23/2020 1:16 PM   Attestation of Attending Supervision of Advanced Practitioner (PA/CNM/NP): Evaluation and management procedures were performed by the Advanced Practitioner under my supervision and collaboration.  I have reviewed the Advanced Practitioner's note and chart, and I agree with the management and plan.  Also, see updated H&P from 07/26/20 in the record  Annamarie Major, MD, [facility], [facility] 07/30/2020  8:11 AM

## 2020-07-23 NOTE — H&P (Deleted)
  The note originally documented on this encounter has been moved the the encounter in which it belongs.  

## 2020-07-25 ENCOUNTER — Inpatient Hospital Stay: Admit: 2020-07-25 | Payer: Self-pay

## 2020-07-26 ENCOUNTER — Inpatient Hospital Stay: Payer: Medicaid Other | Admitting: Anesthesiology

## 2020-07-26 ENCOUNTER — Other Ambulatory Visit: Payer: Self-pay

## 2020-07-26 ENCOUNTER — Inpatient Hospital Stay
Admission: EM | Admit: 2020-07-26 | Discharge: 2020-07-28 | DRG: 807 | Disposition: A | Discharge status: Home / Self Care | Payer: Medicaid Other | Attending: Obstetrics & Gynecology | Admitting: Obstetrics & Gynecology

## 2020-07-26 ENCOUNTER — Encounter: Payer: Self-pay | Admitting: Obstetrics & Gynecology

## 2020-07-26 DIAGNOSIS — Z3A40 40 weeks gestation of pregnancy: Secondary | ICD-10-CM | POA: Diagnosis not present

## 2020-07-26 DIAGNOSIS — Z3480 Encounter for supervision of other normal pregnancy, unspecified trimester: Secondary | ICD-10-CM

## 2020-07-26 DIAGNOSIS — Z20822 Contact with and (suspected) exposure to covid-19: Secondary | ICD-10-CM | POA: Diagnosis not present

## 2020-07-26 DIAGNOSIS — O480 Post-term pregnancy: Principal | ICD-10-CM | POA: Diagnosis present

## 2020-07-26 DIAGNOSIS — O99213 Obesity complicating pregnancy, third trimester: Secondary | ICD-10-CM

## 2020-07-26 LAB — CBC
HCT: 38.7 % (ref 36.0–46.0)
Hemoglobin: 13.1 g/dL (ref 12.0–15.0)
MCH: 32.2 pg (ref 26.0–34.0)
MCHC: 33.9 g/dL (ref 30.0–36.0)
MCV: 95.1 fL (ref 80.0–100.0)
Platelets: 322 10*3/uL (ref 150–400)
RBC: 4.07 MIL/uL (ref 3.87–5.11)
RDW: 14 % (ref 11.5–15.5)
WBC: 20.5 10*3/uL — ABNORMAL HIGH (ref 4.0–10.5)
nRBC: 0 % (ref 0.0–0.2)

## 2020-07-26 LAB — ABO/RH: ABO/RH(D): O POS

## 2020-07-26 LAB — RESP PANEL BY RT-PCR (FLU A&B, COVID) ARPGX2
Influenza A by PCR: NEGATIVE
Influenza B by PCR: NEGATIVE
SARS Coronavirus 2 by RT PCR: NEGATIVE

## 2020-07-26 LAB — TYPE AND SCREEN
ABO/RH(D): O POS
Antibody Screen: NEGATIVE

## 2020-07-26 MED ORDER — ACETAMINOPHEN 325 MG PO TABS: 650.0000 mg | ORAL_TABLET | ORAL | Status: DC | PRN

## 2020-07-26 MED ORDER — BUTORPHANOL TARTRATE 1 MG/ML IJ SOLN: 1.0000 mg | INTRAMUSCULAR | Status: DC | PRN

## 2020-07-26 MED ORDER — ONDANSETRON HCL 4 MG/2ML IJ SOLN
4.0000 mg | Freq: Four times a day (QID) | INTRAMUSCULAR | Status: DC | PRN
Start: 2020-07-26 — End: 2020-07-28
  Administered 2020-07-26 – 2020-07-27 (×2): 4 mg via INTRAVENOUS
  Filled 2020-07-26 (×2): qty 2

## 2020-07-26 MED ORDER — FENTANYL 2.5 MCG/ML W/ROPIVACAINE 0.15% IN NS 100 ML EPIDURAL (ARMC)
12.0000 mL/h | EPIDURAL | Status: DC
  Administered 2020-07-27: 12 mL/h via EPIDURAL
  Filled 2020-07-26: qty 100

## 2020-07-26 MED ORDER — PHENYLEPHRINE 40 MCG/ML (10ML) SYRINGE FOR IV PUSH (FOR BLOOD PRESSURE SUPPORT)
80.0000 ug | PREFILLED_SYRINGE | INTRAVENOUS | Status: DC | PRN
  Filled 2020-07-26: qty 10

## 2020-07-26 MED ORDER — DIPHENHYDRAMINE HCL 50 MG/ML IJ SOLN: 12.5000 mg | INTRAMUSCULAR | Status: DC | PRN

## 2020-07-26 MED ORDER — FENTANYL 2.5 MCG/ML W/ROPIVACAINE 0.15% IN NS 100 ML EPIDURAL (ARMC)
EPIDURAL | Status: AC
  Filled 2020-07-26: qty 100

## 2020-07-26 MED ORDER — OXYTOCIN BOLUS FROM INFUSION
333.0000 mL | Freq: Once | INTRAVENOUS | Status: AC
  Administered 2020-07-27: 333 mL via INTRAVENOUS

## 2020-07-26 MED ORDER — EPHEDRINE 5 MG/ML INJ
10.0000 mg | INTRAVENOUS | Status: DC | PRN
  Filled 2020-07-26: qty 2

## 2020-07-26 MED ORDER — LACTATED RINGERS IV SOLN: 500.0000 mL | Freq: Once | INTRAVENOUS | Status: DC

## 2020-07-26 MED ORDER — LIDOCAINE-EPINEPHRINE (PF) 1.5 %-1:200000 IJ SOLN
INTRAMUSCULAR | Status: DC | PRN
  Administered 2020-07-26: 3 mL via EPIDURAL

## 2020-07-26 MED ORDER — OXYTOCIN-SODIUM CHLORIDE 30-0.9 UT/500ML-% IV SOLN
2.5000 [IU]/h | INTRAVENOUS | Status: DC
  Administered 2020-07-27: 2.5 [IU]/h via INTRAVENOUS
  Filled 2020-07-26: qty 500

## 2020-07-26 MED ORDER — LIDOCAINE HCL (PF) 1 % IJ SOLN
30.0000 mL | INTRAMUSCULAR | Status: DC | PRN
  Filled 2020-07-26: qty 30

## 2020-07-26 MED ORDER — LACTATED RINGERS IV SOLN: INTRAVENOUS | Status: DC

## 2020-07-26 MED ORDER — LACTATED RINGERS IV SOLN: 500.0000 mL | INTRAVENOUS | Status: DC | PRN

## 2020-07-26 MED ORDER — SODIUM CHLORIDE 0.9 % IV SOLN
INTRAVENOUS | Status: DC | PRN
  Administered 2020-07-26: 10 mL via EPIDURAL

## 2020-07-26 MED ORDER — LIDOCAINE HCL (PF) 1 % IJ SOLN
INTRAMUSCULAR | Status: DC | PRN
  Administered 2020-07-26: 3 mL via SUBCUTANEOUS

## 2020-07-26 MED ORDER — FENTANYL 2.5 MCG/ML W/ROPIVACAINE 0.15% IN NS 100 ML EPIDURAL (ARMC)
EPIDURAL | Status: DC | PRN
  Administered 2020-07-26: 12 mL/h via EPIDURAL

## 2020-07-26 NOTE — Anesthesia Preprocedure Evaluation (Signed)
Anesthesia Evaluation  Patient identified by MRN, date of birth, ID band Patient awake    Reviewed: Allergy & Precautions, NPO status , Patient's Chart, lab work & pertinent test results  History of Anesthesia Complications Negative for: history of anesthetic complications  Airway Mallampati: III  TM Distance: >3 FB Neck ROM: Full    Dental  (+) Poor Dentition, Chipped, Missing   Pulmonary neg sleep apnea, neg COPD, Current Smoker and Patient abstained from smoking.,    Pulmonary exam normal breath sounds clear to auscultation       Cardiovascular Exercise Tolerance: Good METS(-) hypertension(-) CAD and (-) Past MI negative cardio ROS  (-) dysrhythmias  Rhythm:Regular Rate:Normal - Systolic murmurs    Neuro/Psych negative neurological ROS  negative psych ROS   GI/Hepatic neg GERD  ,(+)     (-) substance abuse  ,   Endo/Other  neg diabetes  Renal/GU negative Renal ROS     Musculoskeletal   Abdominal   Peds  Hematology   Anesthesia Other Findings Past Medical History: 2011: Abnormal Pap smear of cervix     Comment:  didn't f/u 2011: Endometriosis     Comment:  of ovarian cyst during preg on u/s 2011  Reproductive/Obstetrics (+) Pregnancy                             Anesthesia Physical Anesthesia Plan  ASA: II  Anesthesia Plan: Epidural   Post-op Pain Management:    Induction:   PONV Risk Score and Plan: 2 and Treatment may vary due to age or medical condition and Ondansetron  Airway Management Planned: Natural Airway  Additional Equipment:   Intra-op Plan:   Post-operative Plan:   Informed Consent: I have reviewed the patients History and Physical, chart, labs and discussed the procedure including the risks, benefits and alternatives for the proposed anesthesia with the patient or authorized representative who has indicated his/her understanding and acceptance.        Plan Discussed with: Surgeon  Anesthesia Plan Comments: (Discussed R/B/A of neuraxial anesthesia technique with patient: - rare risks of spinal/epidural hematoma, nerve damage, infection - Risk of PDPH - Risk of itching - Risk of nausea and vomiting - Risk of poor block necessitating replacement of epidural. Patient voiced understanding.)        Anesthesia Quick Evaluation

## 2020-07-26 NOTE — OB Triage Note (Signed)
Pt presented to L/D triage with reported contractions that began this morning at 0900 and have increased in frequency and intensity at 1400. Pain is intermittent, rated 7/10, unrelieved by ambulation, heat, or rest. Pt reports positive fetal movement. No bleeding or LOF. Monitors applied and assessing. FHT 135. VSS.

## 2020-07-26 NOTE — H&P (Signed)
History and Physical Interval Note:  07/26/2020 9:29 PM  Megan Bell is a 35 yo G2P1 at 43 1/[redacted] weeks EGA who has presented with plans for INDUCTION OF LABOR in the near future (08/01/20, 41 weeks),  with the diagnosis of Postdates. She instead presents tonight with progressively worsening contractions and pain.  Denies VB or ROM.  PNC at [facility], prior NSVD 11 years ago.  Plans to breast feed.  She is noted to have regular ctxs by [facility] and cervical change by serial exams.  Her NST is reactive.  The various methods of treatment for early active labor have been discussed with the patient and family. After consideration of risks, benefits and other options for treatment, the patient has consented to  Labor management .  The patient's history has been reviewed, patient examined, no change in status, and is stable for induction as planned.  See H&P. I have reviewed the patient's chart and labs.  Questions were answered to the patient's satisfaction.    Annamarie Major, MD, [facility], [facility] 07/26/2020  9:29 PM

## 2020-07-26 NOTE — Anesthesia Procedure Notes (Signed)
Epidural Patient location during procedure: OB Start time: 07/26/2020 9:47 PM End time: 07/26/2020 10:08 PM  Staffing Anesthesiologist: Corinda Gubler, MD Performed: anesthesiologist   Preanesthetic Checklist Completed: patient identified, IV checked, site marked, risks and benefits discussed, surgical consent, monitors and equipment checked, pre-op evaluation and timeout performed  Epidural Patient position: sitting Prep: ChloraPrep Patient monitoring: heart rate, continuous pulse ox and blood pressure Approach: midline Location: L3-L4 Injection technique: LOR saline  Needle:  Needle type: Tuohy  Needle gauge: 17 G Needle length: 9 cm and 9 Needle insertion depth: 6 cm Catheter type: closed end flexible Catheter size: 19 Gauge Catheter at skin depth: 11 cm Test dose: negative and 1.5% lidocaine with Epi 1:200 K  Assessment Sensory level: T10 Events: blood not aspirated, injection not painful, no injection resistance, no paresthesia and negative IV test  Additional Notes first attempt Pt. Evaluated and documentation done after procedure finished. Patient identified. Risks/Benefits/Options discussed with patient including but not limited to bleeding, infection, nerve damage, paralysis, failed block, incomplete pain control, headache, blood pressure changes, nausea, vomiting, reactions to medication both or allergic, itching and postpartum back pain. Confirmed with bedside nurse the patient's most recent platelet count. Confirmed with patient that they are not currently taking any anticoagulation, have any bleeding history or any family history of bleeding disorders. Patient expressed understanding and wished to proceed. All questions were answered. Sterile technique was used throughout the entire procedure. Please see nursing notes for vital signs. Test dose was given through epidural catheter and negative prior to continuing to dose epidural or start infusion. Warning signs of high  block given to the patient including shortness of breath, tingling/numbness in hands, complete motor block, or any concerning symptoms with instructions to call for help. Patient was given instructions on fall risk and not to get out of bed. All questions and concerns addressed with instructions to call with any issues or inadequate analgesia.   Patient tolerated the insertion well without immediate complications.Reason for block:procedure for pain

## 2020-07-27 ENCOUNTER — Encounter: Payer: Self-pay | Admitting: Advanced Practice Midwife

## 2020-07-27 DIAGNOSIS — Z3A40 40 weeks gestation of pregnancy: Secondary | ICD-10-CM

## 2020-07-27 DIAGNOSIS — O480 Post-term pregnancy: Secondary | ICD-10-CM | POA: Diagnosis not present

## 2020-07-27 LAB — RPR: RPR Ser Ql: NONREACTIVE

## 2020-07-27 MED ORDER — DOCUSATE SODIUM 100 MG PO CAPS
100.0000 mg | ORAL_CAPSULE | Freq: Two times a day (BID) | ORAL | Status: DC
  Filled 2020-07-27: qty 1

## 2020-07-27 MED ORDER — AMMONIA AROMATIC IN INHA
RESPIRATORY_TRACT | Status: AC
  Filled 2020-07-27: qty 10

## 2020-07-27 MED ORDER — MISOPROSTOL 200 MCG PO TABS
ORAL_TABLET | ORAL | Status: AC
  Filled 2020-07-27: qty 4

## 2020-07-27 MED ORDER — COCONUT OIL OIL: 1.0000 "application " | TOPICAL_OIL | Status: DC | PRN

## 2020-07-27 MED ORDER — ACETAMINOPHEN 325 MG PO TABS: 650.0000 mg | ORAL_TABLET | ORAL | Status: DC | PRN

## 2020-07-27 MED ORDER — TERBUTALINE SULFATE 1 MG/ML IJ SOLN: 0.2500 mg | Freq: Once | INTRAMUSCULAR | Status: DC | PRN

## 2020-07-27 MED ORDER — SIMETHICONE 80 MG PO CHEW
80.0000 mg | CHEWABLE_TABLET | ORAL | Status: DC | PRN
Start: 2020-07-27 — End: 2020-07-28

## 2020-07-27 MED ORDER — WITCH HAZEL-GLYCERIN EX PADS: 1.0000 "application " | MEDICATED_PAD | CUTANEOUS | Status: DC | PRN

## 2020-07-27 MED ORDER — DIPHENHYDRAMINE HCL 25 MG PO CAPS: 25.0000 mg | ORAL_CAPSULE | Freq: Four times a day (QID) | ORAL | Status: DC | PRN

## 2020-07-27 MED ORDER — ONDANSETRON HCL 4 MG/2ML IJ SOLN: 4.0000 mg | INTRAMUSCULAR | Status: DC | PRN

## 2020-07-27 MED ORDER — ONDANSETRON HCL 4 MG PO TABS
4.0000 mg | ORAL_TABLET | ORAL | Status: DC | PRN
  Filled 2020-07-27: qty 1

## 2020-07-27 MED ORDER — PRENATAL MULTIVITAMIN CH
1.0000 | ORAL_TABLET | Freq: Every day | ORAL | Status: DC
  Filled 2020-07-27: qty 1

## 2020-07-27 MED ORDER — BENZOCAINE-MENTHOL 20-0.5 % EX AERO: 1.0000 "application " | INHALATION_SPRAY | CUTANEOUS | Status: DC | PRN

## 2020-07-27 MED ORDER — OXYTOCIN 10 UNIT/ML IJ SOLN
INTRAMUSCULAR | Status: AC
  Filled 2020-07-27: qty 2

## 2020-07-27 MED ORDER — OXYTOCIN 10 UNIT/ML IJ SOLN
INTRAMUSCULAR | Status: AC
  Filled 2020-07-27: qty 1

## 2020-07-27 MED ORDER — OXYCODONE HCL 5 MG PO TABS: 10.0000 mg | ORAL_TABLET | ORAL | Status: DC | PRN

## 2020-07-27 MED ORDER — IBUPROFEN 600 MG PO TABS
600.0000 mg | ORAL_TABLET | Freq: Four times a day (QID) | ORAL | Status: DC
  Administered 2020-07-27 – 2020-07-28 (×5): 600 mg via ORAL
  Filled 2020-07-27 (×6): qty 1

## 2020-07-27 MED ORDER — ZOLPIDEM TARTRATE 5 MG PO TABS: 5.0000 mg | ORAL_TABLET | Freq: Every evening | ORAL | Status: DC | PRN

## 2020-07-27 MED ORDER — LIDOCAINE HCL (PF) 1 % IJ SOLN
INTRAMUSCULAR | Status: AC
  Filled 2020-07-27: qty 30

## 2020-07-27 MED ORDER — OXYTOCIN-SODIUM CHLORIDE 30-0.9 UT/500ML-% IV SOLN
1.0000 m[IU]/min | INTRAVENOUS | Status: DC
  Administered 2020-07-27: 2 m[IU]/min via INTRAVENOUS
  Filled 2020-07-27: qty 500

## 2020-07-27 MED ORDER — DIBUCAINE (PERIANAL) 1 % EX OINT: 1.0000 "application " | TOPICAL_OINTMENT | CUTANEOUS | Status: DC | PRN

## 2020-07-27 MED ORDER — OXYCODONE HCL 5 MG PO TABS: 5.0000 mg | ORAL_TABLET | ORAL | Status: DC | PRN

## 2020-07-27 NOTE — Lactation Note (Signed)
This note was copied from a baby's chart. Lactation Consultation Note  Patient Name: Megan Bell [id number] Date: 07/27/2020 Reason for consult: Follow-up assessment Age:35 hours  Maternal Data    Feeding Mother's Current Feeding Choice: Breast Milk  LATCH Score Latch: Too sleepy or reluctant, no latch achieved, no sucking elicited. Lactation to the room in postpartum, baby was asleep skin to skin with Mother.. Encouraged feeding on demand and with cues. If baby is not cueing encouraged hand expression and skin to skin. Taught proper technique for hand expression and spoon feeding. He was fed a few drops, began to bob around on Mother's chest. Attempted latch but his lips were pursed and he was disinterested. Baby was left skin to skin with Mother. Encouraged 8 or more attempts in the first 24 hours and 8 or more good feeds after 24 HOL. Reviewed appropriate diapers for days of life and How to know your baby is getting enough to eat. Reviewed "Understanding Postpartum and Newborn Care" booklet at bedside. [facility] # left on board, encouraged to call for any assistance. Mother has no further questions at this time.    Interventions Interventions: Hand express;Breast feeding basics reviewed;Assisted with latch  Discharge    Consult Status Consult Status: Follow-up Date: 07/28/20 Follow-up type: Call as needed    Markiesha Delia D Oluwaseun Bruyere 07/27/2020, 3:45 PM

## 2020-07-27 NOTE — Lactation Note (Signed)
This note was copied from a baby's chart. Lactation Consultation Note  Patient Name: Megan Bell [id number] Date: 07/27/2020 Reason for consult: L&D Initial assessment;Difficult latch Age:35 hours Lactation to the L&D room for initial visit. Mother is holding the baby and baby was crying. LC noted a notch in the tip of baby's tongue while he was crying and tight lingual frenulum. Encouraged feeding on demand and with cues. If baby is not cueing encouraged hand expression and skin to skin. Taught proper technique for hand expression. Expressed drops into baby's mouth. Reclined Mother and he was able to latch and maintain with a rhythmic sucking pattern and swallows noted after several attempts. Encouraged 8 or more attempts in the first 24 hours and 8 or more good feeds after 24 HOL. Transition RN updated. Mother has no further questions at this time.   Maternal Data Has patient been taught Hand Expression?: Yes Does the patient have breastfeeding experience prior to this delivery?: Yes How long did the patient breastfeed?: 3-4 weeks (mostly pumped, LMS)  Feeding Mother's Current Feeding Choice: Breast Milk  LATCH Score Latch: Repeated attempts needed to sustain latch, nipple held in mouth throughout feeding, stimulation needed to elicit sucking reflex. (notch in tip of tongue, frenulum tight and restrictive, limited elevation)  Audible Swallowing: A few with stimulation  Type of Nipple: Everted at rest and after stimulation  Comfort (Breast/Nipple): Soft / non-tender  Hold (Positioning): Assistance needed to correctly position infant at breast and maintain latch.  LATCH Score: 7   Lactation Tools Discussed/Used    Interventions Interventions: Assisted with latch;Breast feeding basics reviewed;Hand express;Adjust position  Discharge Pump: Personal (has a Lansinoh pump at home)  Consult Status      Megan Bell 07/27/2020, 10:33 AM

## 2020-07-27 NOTE — Discharge Instructions (Signed)

## 2020-07-27 NOTE — Discharge Summary (Signed)
Physician Discharge Summary  Patient ID: Megan Bell MRN: [id number] DOB/AGE: 1985/05/10 35 y.o.  Admit date: 07/26/2020 Discharge date: 07/28/2020  Admission Diagnoses:spontaneous labor at term  Discharge Diagnoses:  Active Problems:   Labor and delivery, indication for care   Normal spontaneous vaginal delivery   Postpartum care following vaginal delivery   Discharged Condition: good  Hospital Course: Ms. Megan Bell was admitted to [facility] on 07/26/2020 in active labor.  She received an epidural and made steady labor progress. Her labor was augmented with AROM (moderate meconium noted) and some low dose pitocin. She was declared completely dilated at approximately 0835 on 07/27/2020, and she began pushing shortly thereafter. She delivered a newborn female at 43 over a first degree laceration.  Her postpartum course was uneventful, and she is discharged home on Postpartum Day 1, with plans for followup in 6 weeks at [facility]. Her husband plans a vasectomy- in the meantime, she polans on using POPs for contraception.  Consults: None  Significant Diagnostic Studies: none  Treatments: Normal labor and vaginal delivery.  Discharge Exam: Blood pressure 103/63, pulse 66, temperature 98.8 F (37.1 C), temperature source Oral, resp. rate 18, height 5\' 4"  (1.626 m), weight 99.3 kg, SpO2 96 %, unknown if currently breastfeeding. General appearance: alert, cooperative, no distress and moderately obese Head: Normocephalic, without obvious abnormality, atraumatic Back: negative, symmetric, no curvature. ROM normal. No CVA tenderness. Resp: clear to auscultation bilaterally Breasts: normal appearance, no masses or tenderness, No nipple retraction or dimpling, + colostrum . is breastfeeding Cardio: regular rate and rhythm, S1, S2 normal, no murmur, click, rub or gallop GI: soft, non-tender; bowel sounds normal; no masses,  no organomegaly Pelvic: external genitalia normal and fundus is firm at U.  Lochia is scant rubra. slight labial edema. Extremities: edema - trace only  Disposition: Discharge disposition: 01-Home or Self Care       Discharge Instructions    Activity as tolerated   Complete by: As directed    Call MD for:   Complete by: As directed    Call MD for:  difficulty breathing, headache or visual disturbances   Complete by: As directed    Call MD for:  extreme fatigue   Complete by: As directed    Call MD for:  hives   Complete by: As directed    Call MD for:  persistant dizziness or light-headedness   Complete by: As directed    Call MD for:  persistant nausea and vomiting   Complete by: As directed    Call MD for:  redness, tenderness, or signs of infection (pain, swelling, redness, odor or green/yellow discharge around incision site)   Complete by: As directed    Call MD for:  severe uncontrolled pain   Complete by: As directed    Call MD for:  temperature >100.4   Complete by: As directed    Diet - low sodium heart healthy   Complete by: As directed    Sexual activity   Complete by: As directed    Avoid intercourse for 6 weeks     Allergies as of 07/28/2020   No Known Allergies     Medication List    TAKE these medications   multivitamin-prenatal 27-0.8 MG Tabs tablet Take 1 tablet by mouth daily at 12 noon.   ondansetron 8 MG disintegrating tablet Commonly known as: ZOFRAN-ODT Take 8 mg by mouth every 8 (eight) hours as needed.   ZYRTEC-D PO Take by mouth.  Follow-up Information    Mirna [name], CNM. Schedule an appointment as soon as possible for a visit in 6 week(s).   Specialties: Obstetrics, Gynecology Why:  Please call [facility] and make an appointment  for a 6 week postaprtum physical. Contact information: [address] [place] [address] [phone]               Signed: Mirna [name] 07/28/2020, 11:44 AM

## 2020-07-27 NOTE — Progress Notes (Signed)
  Labor Progress Note   35 y.o. G2P1001 @ [redacted]w[redacted]d , admitted for  Pregnancy, Labor Management.   Subjective:  Resting welll w epidural  Objective:  BP (!) 97/47   Pulse 70   Temp 97.9 F (36.6 C) (Oral)   Resp 20   Ht 5\' 4"  (1.626 m)   Wt 99.3 kg   LMP  (LMP Unknown)   SpO2 95%   BMI 37.59 kg/m  Abd: gravid, ND, FHT present, without guarding, without rebound tenderness on exam Extr: trace to 1+ bilateral pedal edema SVE: 9 cm  EFM: FHR: 140 bpm, variability: moderate,  accelerations:  Present,  decelerations:  Absent Toco: Frequency: Every 4-6 minutes Labs: I have reviewed the patient's lab results.   Assessment & Plan:  G2P1001 @ [redacted]w[redacted]d, admitted for  Pregnancy and Labor/Delivery Management  1. Pain management: epidural. 2. FWB: FHT category 1.  3. ID: GBS negative 4. Labor management: Anticipate second stage soon Pt had SROM then AROM of forebag earlier, mec stained fluid noted  All discussed with patient, see orders  [redacted]w[redacted]d, MD, Annamarie Major Ob/Gyn, [facility] 07/27/2020  5:57 AM

## 2020-07-28 ENCOUNTER — Encounter: Payer: Medicaid Other | Admitting: Obstetrics and Gynecology

## 2020-07-28 LAB — CBC
HCT: 29.9 % — ABNORMAL LOW (ref 36.0–46.0)
Hemoglobin: 10.4 g/dL — ABNORMAL LOW (ref 12.0–15.0)
MCH: 33 pg (ref 26.0–34.0)
MCHC: 34.8 g/dL (ref 30.0–36.0)
MCV: 94.9 fL (ref 80.0–100.0)
Platelets: 270 10*3/uL (ref 150–400)
RBC: 3.15 MIL/uL — ABNORMAL LOW (ref 3.87–5.11)
RDW: 14.5 % (ref 11.5–15.5)
WBC: 14.4 10*3/uL — ABNORMAL HIGH (ref 4.0–10.5)
nRBC: 0 % (ref 0.0–0.2)

## 2020-07-28 NOTE — Anesthesia Postprocedure Evaluation (Signed)
Anesthesia Post Note  Patient: Megan Bell  Procedure(s) Performed: AN AD HOC LABOR EPIDURAL  Patient location during evaluation: Mother Baby Anesthesia Type: Epidural Level of consciousness: awake, awake and alert and oriented Pain management: pain level controlled Vital Signs Assessment: post-procedure vital signs reviewed and stable Respiratory status: spontaneous breathing, nonlabored ventilation and respiratory function stable Cardiovascular status: blood pressure returned to baseline and stable Postop Assessment: no headache and no backache Anesthetic complications: no   No complications documented.   Last Vitals:  Vitals:   07/27/20 2313 07/28/20 0732  BP: 120/72 103/63  Pulse: 74 66  Resp: 18   Temp: 36.9 C 37.1 C  SpO2: 97% 96%    Last Pain:  Vitals:   07/28/20 0732  TempSrc: Oral  PainSc:                  Ginger Carne

## 2020-07-28 NOTE — Lactation Note (Signed)
This note was copied from a baby's chart. Lactation Consultation Note  Patient Name: Megan Bell [id number] Date: 07/28/2020 Reason for consult: Follow-up assessment;Term Age:35 hours  Lactation follow-up prior to anticipated discharge. This is mom's second baby. Baby Megan has had long periods between feedings, and circumcision this morning.   In discussion, mom reports feeding plan of put to breast on demand followed by pumping, and has formula if needed for supplement. Mom states she is confident in going home, her feeding plan, and available support.  LC praised family for their plan and support of one another. Iterated the importance of feeding frequency for baby's age, and output expectations for HOL. Despite circumcision this morning LC offered to assist with a feeding attempt- mom declined at this time since baby was sleeping.   LC reviewed with mom milk supply and demand and normal course of lactation, encouraged frequent breast stimulation and milk removal to help in building an adequate supply for baby. Mom verbalizes understanding, and reports being willing to "do all the things" to ensure baby is fed and healthy.  Lactation provided education on newborn stomach size, feeding patterns and behaviors, growth spurts, positioning and deep latch- as parents report pediatrician was not concerned about tongue tie- potential need for nipple shield, and bottle introduction (paced-bottle feeding) if needed.  Mom continued to verbalize understanding, and voice that she is prepared for all things and feels confident in her feeding plan.  Encouraged to feed baby and have lactation observe before discharge, pointed out contact number on the board. Discussed outpatient lactation services as well and community breastfeeding support services available.   Maternal Data Has patient been taught Hand Expression?: Yes Does the patient have breastfeeding experience prior to this delivery?: Yes How long  did the patient breastfeed?: 3-4 weeks  Feeding Mother's Current Feeding Choice: Breast Milk  LATCH Score                    Lactation Tools Discussed/Used    Interventions Interventions: Breast feeding basics reviewed;Hand express;Education  Discharge Discharge Education: Engorgement and breast care;Warning signs for feeding baby;Outpatient recommendation Pump: Personal  Consult Status Consult Status: PRN Date: 07/28/20 Follow-up type: Call as needed    Danford Bad 07/28/2020, 11:25 AM

## 2020-07-29 ENCOUNTER — Telehealth: Payer: Self-pay

## 2020-07-29 NOTE — Telephone Encounter (Signed)
Transition Care Management Follow-up Telephone Call Date of discharge and from where: 07/28/2020 from [facility] How have you been since you were released from the hospital? Pt stated that she is feeling well.  Any questions or concerns? No  Items Reviewed: Did the pt receive and understand the discharge instructions provided? Yes  Medications obtained and verified? Yes  Other? No  Any new allergies since your discharge? No  Dietary orders reviewed? Yes Do you have support at home? Yes   Functional Questionnaire: (I = Independent and D = Dependent) ADLs: I  Bathing/Dressing- I  Meal Prep- I  Eating- I  Maintaining continence- I  Transferring/Ambulation- I  Managing Meds- I   Follow up appointments reviewed:  PCP Hospital f/u appt confirmed? No   Specialist Hospital f/u appt confirmed? Yes  Scheduled to see Paula Compton, CNM on 09/07/2020 Are transportation arrangements needed? No  If their condition worsens, is the pt aware to call PCP or go to the Emergency Dept.? Yes Was the patient provided with contact information for the PCP's office or ED? Yes Was to pt encouraged to call back with questions or concerns? Yes

## 2020-09-07 ENCOUNTER — Ambulatory Visit (INDEPENDENT_AMBULATORY_CARE_PROVIDER_SITE_OTHER): Payer: Medicaid Other | Admitting: Obstetrics

## 2020-09-07 ENCOUNTER — Other Ambulatory Visit (HOSPITAL_COMMUNITY)
Admission: RE | Admit: 2020-09-07 | Discharge: 2020-09-07 | Disposition: A | Discharge status: Home / Self Care | Payer: Medicaid Other | Source: Ambulatory Visit | Attending: Obstetrics | Admitting: Obstetrics

## 2020-09-07 ENCOUNTER — Other Ambulatory Visit: Payer: Self-pay

## 2020-09-07 ENCOUNTER — Encounter: Payer: Self-pay | Admitting: Obstetrics

## 2020-09-07 DIAGNOSIS — Z124 Encounter for screening for malignant neoplasm of cervix: Secondary | ICD-10-CM

## 2020-09-07 DIAGNOSIS — Z3009 Encounter for other general counseling and advice on contraception: Secondary | ICD-10-CM

## 2020-09-07 MED ORDER — NORETHIN ACE-ETH ESTRAD-FE 1-20 MG-MCG PO TABS: 1.0000 | ORAL_TABLET | Freq: Every day | ORAL | 11 refills | Status: DC

## 2020-09-07 NOTE — Progress Notes (Signed)
Postpartum Visit  Chief Complaint: No chief complaint on file.   History of Present Illness: Patient is a 35 y.o. [name] presents for postpartum visit.  Date of delivery: 07/27/2020 Type of delivery: Vaginal delivery - Vacuum or forceps assisted  no Episiotomy No.  Laceration: yes  first degree Pregnancy or labor problems:  no Any problems since the delivery:  no  Newborn Details:  SINGLETON :  1. Baby's name: female. Birth weight: 7lbs 6 oz Maternal Details:  Breast Feeding:  was nursing initially ; now in process of weaning Post partum depression/anxiety noted:  no Edinburgh Post-Partum Depression Score:  3  Date of last PAP: 2019normal   Past Medical History:  Diagnosis Date   Abnormal Pap smear of cervix 2011   didn't f/u   Endometriosis 2011   of ovarian cyst during preg on u/s 2011    Past Surgical History:  Procedure Laterality Date   LAPAROSCOPIC CHOLECYSTECTOMY  2011   Dr. Evette Cristal    Prior to Admission medications   Medication Sig Start Date End Date Taking? Authorizing Provider  Cetirizine-Pseudoephedrine (ZYRTEC-D PO) Take by mouth.   Yes [provider]  Prenatal Vit-Fe Fumarate-FA (MULTIVITAMIN-PRENATAL) Megan-0.8 MG TABS tablet Take 1 tablet by mouth daily at 12 noon.   Yes [provider]  ondansetron (ZOFRAN-ODT) 8 MG disintegrating tablet Take 8 mg by mouth every 8 (eight) hours as needed. 05/24/20   [provider]    No Known Allergies   Social History   Socioeconomic History   Marital status: Single    Spouse name: Not on file   Number of children: 1   Years of education: 13   Highest education level: Not on file  Occupational History   Occupation: pharm tech    Comment: Designer, jewellery  Tobacco Use   Smoking status: Every Day    Packs/day: 0.50    Types: Cigarettes, E-cigarettes   Smokeless tobacco: Never  Vaping Use   Vaping Use: Never used  Substance and Sexual Activity   Alcohol use: Yes    Comment: 2/hs    Drug use: No   Sexual activity: Yes    Birth control/protection: Pill    Comment: pop  Other Topics Concern   Not on file  Social History Narrative   Not on file   Social Determinants of Health   Financial Resource Strain: Not on file  Food Insecurity: Not on file  Transportation Needs: Not on file  Physical Activity: Not on file  Stress: Not on file  Social Connections: Not on file  Intimate Partner Violence: Not on file    Family History  Problem Relation Age of Onset   Heart disease Maternal Grandfather        triple bypass   Breast cancer Paternal Grandmother 50    ROS   Physical Exam BP 110/70   Ht 5\' 3"  (1.6 m)   Wt 209 lb (94.8 kg)   BMI 37.02 kg/m   OBGyn Exam   Female Chaperone present during breast and/or pelvic exam.  Assessment: 35 y.o. 20 presenting for 6 week postpartum visit  Plan: Problem List Items Addressed This Visit       Other   Postpartum care following vaginal delivery - Primary   Relevant Orders   Cytology - PAP   Other Visit Diagnoses     Pap smear for cervical cancer screening       Relevant Orders   Cytology - PAP  1) Contraception Education given regarding options for contraception, including oral contraceptives.her husband is having a vasectomy. She wants OCPs until his procedure has been completed.Marland Kitchen Rx for [facility] sent .  2)  Pap - ASCCP guidelines and rational discussed.  Patient opts for 3 year screening interval  3) Patient underwent screening for postpartum depression with no concerns noted.  4) Follow up 1 year for routine annual exam Mirna [name], CNM  09/07/2020 5:16 PM   09/07/2020 3:05 PM

## 2020-09-10 LAB — CYTOLOGY - PAP
Comment: NEGATIVE
Diagnosis: NEGATIVE
High risk HPV: NEGATIVE

## 2020-09-17 ENCOUNTER — Encounter: Payer: Self-pay | Admitting: Obstetrics

## 2021-07-20 ENCOUNTER — Other Ambulatory Visit: Payer: Self-pay | Admitting: Obstetrics

## 2021-07-20 DIAGNOSIS — Z3009 Encounter for other general counseling and advice on contraception: Secondary | ICD-10-CM

## 2021-12-26 ENCOUNTER — Ambulatory Visit (LOCAL_COMMUNITY_HEALTH_CENTER): Payer: Self-pay

## 2021-12-26 VITALS — BP 135/60 | Ht 63.0 in | Wt 198.0 lb

## 2021-12-26 DIAGNOSIS — Z3201 Encounter for pregnancy test, result positive: Secondary | ICD-10-CM

## 2021-12-26 LAB — PREGNANCY, URINE: Preg Test, Ur: POSITIVE — AB

## 2021-12-26 MED ORDER — PRENATAL 27-0.8 MG PO TABS: 1.0000 | ORAL_TABLET | Freq: Every day | ORAL | 0 refills | Status: AC

## 2021-12-26 NOTE — Progress Notes (Signed)
UPT positive. Wants to start prenatal care at [facility]. LMP 10/09/2021, approx date. Hx irreg periods. Taking ocp and stopped them when had home +UPT which was on 12/02/2021.   The patient was dispensed prenatal vitamins #100 today. I provided counseling today regarding the medication. We discussed the medication, the side effects and when to call clinic. Patient given the opportunity to ask questions. Questions answered.    Sent to clerk for preadmit. Josie Saunders, RN

## 2022-01-20 ENCOUNTER — Ambulatory Visit: Payer: Medicaid Other | Admitting: Advanced Practice Midwife

## 2022-01-20 ENCOUNTER — Encounter: Payer: Self-pay | Admitting: Advanced Practice Midwife

## 2022-01-20 VITALS — BP 123/81 | HR 84 | Temp 97.2°F | Wt 195.0 lb

## 2022-01-20 DIAGNOSIS — Z23 Encounter for immunization: Secondary | ICD-10-CM | POA: Diagnosis not present

## 2022-01-20 DIAGNOSIS — O0992 Supervision of high risk pregnancy, unspecified, second trimester: Secondary | ICD-10-CM | POA: Diagnosis not present

## 2022-01-20 DIAGNOSIS — K08109 Complete loss of teeth, unspecified cause, unspecified class: Secondary | ICD-10-CM | POA: Insufficient documentation

## 2022-01-20 DIAGNOSIS — Z9151 Personal history of suicidal behavior: Secondary | ICD-10-CM

## 2022-01-20 DIAGNOSIS — Z720 Tobacco use: Secondary | ICD-10-CM | POA: Diagnosis not present

## 2022-01-20 DIAGNOSIS — O09522 Supervision of elderly multigravida, second trimester: Secondary | ICD-10-CM

## 2022-01-20 DIAGNOSIS — O09529 Supervision of elderly multigravida, unspecified trimester: Secondary | ICD-10-CM | POA: Insufficient documentation

## 2022-01-20 DIAGNOSIS — Z862 Personal history of diseases of the blood and blood-forming organs and certain disorders involving the immune mechanism: Secondary | ICD-10-CM | POA: Insufficient documentation

## 2022-01-20 DIAGNOSIS — O99320 Drug use complicating pregnancy, unspecified trimester: Secondary | ICD-10-CM | POA: Insufficient documentation

## 2022-01-20 DIAGNOSIS — Z7289 Other problems related to lifestyle: Secondary | ICD-10-CM

## 2022-01-20 DIAGNOSIS — O99210 Obesity complicating pregnancy, unspecified trimester: Secondary | ICD-10-CM

## 2022-01-20 DIAGNOSIS — O99212 Obesity complicating pregnancy, second trimester: Secondary | ICD-10-CM

## 2022-01-20 DIAGNOSIS — F1290 Cannabis use, unspecified, uncomplicated: Secondary | ICD-10-CM | POA: Insufficient documentation

## 2022-01-20 HISTORY — DX: Personal history of suicidal behavior: Z91.51

## 2022-01-20 LAB — WET PREP FOR TRICH, YEAST, CLUE
Trichomonas Exam: NEGATIVE
Yeast Exam: NEGATIVE

## 2022-01-20 LAB — HEMOGLOBIN, FINGERSTICK: Hemoglobin: 13.2 g/dL (ref 11.1–15.9)

## 2022-01-20 NOTE — Progress Notes (Addendum)
Presents for initiation of prenatal care. Denies any medical care thus far in pregnancy. Per client, plans transfer to [facility] when approved for Medicaid. History of irregular menses. Client missed few days of ocps when on trip to [place] [employer] in 09/2021. Continued to take ocps until positive home UPT in October. Jossie Ng, RN Wet prep reviewed - negative result and hgb = 13.2 (no iron supplement needed). [facility] [place] / genetic counseling referral faxed with snapshot pages and fax confirmation received. Client counseled [facility] will call her with [place] appt and will make attempt QD x3. Jossie Ng, RN

## 2022-01-20 NOTE — Progress Notes (Signed)
[facility] Department  Maternal Health Clinic   INITIAL PRENATAL VISIT NOTE  Subjective:  Megan Bell is a 36 y.o. engaged WF vaper G3P2002 (12, 18 mo) at [redacted]w[redacted]d being seen today to start prenatal care at the [facility] Department. She feels "it's not an enjoyable time for me with nausea" about unplanned pregnancy when she missed several ocp's. 36 yo employed FOB feels "same as me but where are we gonna put it?" About pregnancy; in supportive 5 year relationship; he is the father of her 16 mo old and also has a 60 yo son who he has full custody of that lives with them. She is not working and living with FOB and her 2 kids and his 15 yo son.  Unsure LMP 09/2021 and she had a + PT 12/02/21. Denies u/s this pregnancy or ER use this pregnancy. Last dental exam 07/2021 but then she lost her Medicaid; has many missing teeth in upper jaw. Pap 05/11/17=neg HPV neg. Pap 08/1920=neg HPV neg. Last cig 2022. Currently vapes. Denies cigars. Last MJ 11/20/21. Last ETOH 11/20/21 (2 mixed drinks) 4x/wk.  Cutter ages [age]. Hx OD on Advil age 22.  She is currently monitored for the following issues for this high-risk pregnancy and has Advanced maternal age in multigravida 36 yo; Obesity affecting pregnancy BMI=34.5; Supervision of high risk pregnancy in second trimester; and Vapes nicotine containing substance on their problem list.  Patient reports no complaints.  Contractions: Not present. Vag. Bleeding: None.  Movement: Absent. Denies leaking of fluid.   Indications for ASA therapy (per uptodate) One of the following: Previous pregnancy with preeclampsia, especially early onset and with an adverse outcome No Multifetal gestation No Chronic hypertension No Type 1 or 2 diabetes mellitus No Chronic kidney disease No Autoimmune disease (antiphospholipid syndrome, systemic lupus erythematosus) No  Two or more of the following: Nulliparity No Obesity (body mass index >30 kg/m2) Yes Family history  of preeclampsia in mother or sister No Age ?35 years Yes Sociodemographic characteristics (African American race, low socioeconomic level) No Personal risk factors (eg, previous pregnancy with low birth weight or small for gestational age infant, previous adverse pregnancy outcome [eg, stillbirth], interval >10 years between pregnancies) No   The following portions of the patient's history were reviewed and updated as appropriate: allergies, current medications, past family history, past medical history, past social history, past surgical history and problem list. Problem list updated.  Objective:   Vitals:   01/20/22 0858  BP: 123/81  Pulse: 84  Temp: (!) 97.2 F (36.2 C)  Weight: 195 lb (88.5 kg)    Fetal Status: Fetal Heart Rate (bpm): 160 Fundal Height: 12 cm Movement: Absent      Physical Exam Vitals and nursing note reviewed.  Constitutional:      General: She is not in acute distress.    Appearance: Normal appearance. She is well-developed. She is obese.  HENT:     Head: Normocephalic and atraumatic.     Right Ear: External ear normal.     Left Ear: External ear normal.     Nose: Nose normal. No congestion or rhinorrhea.     Mouth/Throat:     Lips: Pink.     Mouth: Mucous membranes are moist.     Dentition: Normal dentition. No dental caries.     Pharynx: Oropharynx is clear. Uvula midline.     Comments: Dentition: poor many missing teeth and sometimes wears partial Eyes:     General: No scleral  icterus.    Conjunctiva/sclera: Conjunctivae normal.  Neck:     Thyroid: No thyroid mass, thyromegaly or thyroid tenderness.  Cardiovascular:     Rate and Rhythm: Normal rate.     Pulses: Normal pulses.     Comments: Extremities are warm and well perfused Pulmonary:     Effort: Pulmonary effort is normal.     Breath sounds: Normal breath sounds.  Chest:     Chest wall: No mass.  Breasts:    Tanner Score is 5.     Breasts are symmetrical.     Right: Normal. No  mass, nipple discharge or skin change.     Left: Normal. No mass, nipple discharge or skin change.  Abdominal:     Palpations: Abdomen is soft.     Tenderness: There is no abdominal tenderness.     Comments: Gravid, soft, poor tone, without tenderness, FHR=160, FH=12  Genitourinary:    General: Normal vulva.     Exam position: Lithotomy position.     Pubic Area: No rash.      Labia:        Right: No rash.        Left: No rash.      Vagina: Vaginal discharge (white creamy leukorrhea, ph<4.5) present.     Cervix: Normal.     Uterus: Normal. Enlarged (Gravid 12 wks size but difficult to assess due to increased adipose). Not tender.      Rectum: Normal. No external hemorrhoid.  Musculoskeletal:     Right lower leg: No edema.     Left lower leg: No edema.  Lymphadenopathy:     Cervical: No cervical adenopathy.     Upper Body:     Right upper body: No axillary adenopathy.     Left upper body: No axillary adenopathy.  Skin:    General: Skin is warm.     Capillary Refill: Capillary refill takes less than 2 seconds.  Neurological:     Mental Status: She is alert.     Assessment and Plan:  Pregnancy: G3P2002 at [redacted]w[redacted]d  1. Obesity affecting pregnancy, antepartum, unspecified obesity type Counseled on weight gain of 11-20 lbs this pregnancy Accepts ASA 81 mg daily to begin today  2. Supervision of high risk pregnancy in second trimester Pt desires NIPS U/s ordered for dating 1 hour glucola today  - Prenatal Profile I - MaterniT 21 plus Core, Blood - Comprehensive metabolic panel - Glucose, 1 hour - TSH - Protein / creatinine ratio, urine - Hgb A1c w/o eAG - [id number] Drug Screen - WET PREP FOR TRICH, YEAST, CLUE - Hemoglobin, venipuncture  3. Vapes nicotine containing substance Counseled via 5 A's to stop    Discussed overview of care and coordination with inpatient delivery practices including [facility], [facility], [facility] and [facility].   Reviewed Centering  pregnancy as standard of care at [facility]   Preterm labor symptoms and general obstetric precautions including but not limited to vaginal bleeding, contractions, leaking of fluid and fetal movement were reviewed in detail with the patient.  Please refer to After Visit Summary for other counseling recommendations.   Return in about 4 weeks (around 02/17/2022) for routine PNC.  No future appointments.  Herbie Saxon, CNM

## 2022-01-23 LAB — HGB A1C W/O EAG: Hgb A1c MFr Bld: 5 % (ref 4.8–5.6)

## 2022-01-23 LAB — PREGNANCY, INITIAL SCREEN
Antibody Screen: NEGATIVE
Basophils Absolute: 0 10*3/uL (ref 0.0–0.2)
Basos: 1 %
Bilirubin, UA: NEGATIVE
Chlamydia trachomatis, NAA: NEGATIVE
EOS (ABSOLUTE): 0.1 10*3/uL (ref 0.0–0.4)
Eos: 1 %
Glucose, UA: NEGATIVE
HCV Ab: NONREACTIVE
HIV Screen 4th Generation wRfx: NONREACTIVE
Hematocrit: 38.7 % (ref 34.0–46.6)
Hemoglobin: 13.1 g/dL (ref 11.1–15.9)
Hepatitis B Surface Ag: NEGATIVE
Immature Grans (Abs): 0 10*3/uL (ref 0.0–0.1)
Immature Granulocytes: 0 %
Ketones, UA: NEGATIVE
Leukocytes,UA: NEGATIVE
Lymphocytes Absolute: 2.2 10*3/uL (ref 0.7–3.1)
Lymphs: 29 %
MCH: 31.8 pg (ref 26.6–33.0)
MCHC: 33.9 g/dL (ref 31.5–35.7)
MCV: 94 fL (ref 79–97)
Monocytes Absolute: 0.3 10*3/uL (ref 0.1–0.9)
Monocytes: 4 %
Neisseria Gonorrhoeae by PCR: NEGATIVE
Neutrophils Absolute: 4.9 10*3/uL (ref 1.4–7.0)
Neutrophils: 65 %
Nitrite, UA: NEGATIVE
Platelets: 311 10*3/uL (ref 150–450)
Protein,UA: NEGATIVE
RBC, UA: NEGATIVE
RBC: 4.12 x10E6/uL (ref 3.77–5.28)
RDW: 12.4 % (ref 11.7–15.4)
RPR Ser Ql: NONREACTIVE
Rh Factor: POSITIVE
Rubella Antibodies, IGG: 24.6 index (ref >0.99)
Specific Gravity, UA: 1.015 (ref 1.005–1.030)
Urobilinogen, Ur: 0.2 mg/dL (ref 0.2–1.0)
WBC: 7.5 10*3/uL (ref 3.4–10.8)
pH, UA: 7 (ref 5.0–7.5)

## 2022-01-23 LAB — COMPREHENSIVE METABOLIC PANEL
ALT: 44 IU/L — ABNORMAL HIGH (ref 0–32)
AST: 23 IU/L (ref 0–40)
Albumin/Globulin Ratio: 1.8 (ref 1.2–2.2)
Albumin: 4.8 g/dL (ref 3.9–4.9)
Alkaline Phosphatase: 74 IU/L (ref 44–121)
BUN/Creatinine Ratio: 9 (ref 9–23)
BUN: 5 mg/dL — ABNORMAL LOW (ref 6–20)
Bilirubin Total: 0.3 mg/dL (ref 0.0–1.2)
CO2: 23 mmol/L (ref 20–29)
Calcium: 9.2 mg/dL (ref 8.7–10.2)
Chloride: 101 mmol/L (ref 96–106)
Creatinine, Ser: 0.53 mg/dL — ABNORMAL LOW (ref 0.57–1.00)
Globulin, Total: 2.6 g/dL (ref 1.5–4.5)
Glucose: 77 mg/dL (ref 70–99)
Potassium: 4.1 mmol/L (ref 3.5–5.2)
Sodium: 137 mmol/L (ref 134–144)
Total Protein: 7.4 g/dL (ref 6.0–8.5)
eGFR: 123 mL/min/{1.73_m2} (ref >59)

## 2022-01-23 LAB — HCV INTERPRETATION

## 2022-01-23 LAB — MICROSCOPIC EXAMINATION
Casts: NONE SEEN /lpf
WBC, UA: NONE SEEN /hpf (ref 0–5)

## 2022-01-23 LAB — TSH: TSH: 0.736 u[IU]/mL (ref 0.450–4.500)

## 2022-01-23 LAB — URINE CULTURE, OB REFLEX: Organism ID, Bacteria: NO GROWTH

## 2022-01-23 LAB — GLUCOSE, 1 HOUR GESTATIONAL: Gestational Diabetes Screen: 75 mg/dL (ref 70–139)

## 2022-01-24 LAB — 789231 7+OXYCODONE-BUND
Amphetamines, Urine: NEGATIVE ng/mL
BENZODIAZ UR QL: NEGATIVE ng/mL
Barbiturate screen, urine: NEGATIVE ng/mL
Cocaine (Metab.): NEGATIVE ng/mL
OPIATE SCREEN URINE: NEGATIVE ng/mL
Oxycodone/Oxymorphone, Urine: NEGATIVE ng/mL
PCP Quant, Ur: NEGATIVE ng/mL

## 2022-01-24 LAB — PROTEIN / CREATININE RATIO, URINE
Creatinine, Urine: 78.7 mg/dL
Protein, Ur: 7.7 mg/dL
Protein/Creat Ratio: 98 mg/g creat (ref 0–200)

## 2022-01-24 LAB — CANNABINOID CONFIRMATION, UR
CANNABINOIDS: POSITIVE — AB
Carboxy THC GC/MS Conf: >750 ng/mL

## 2022-01-26 ENCOUNTER — Telehealth: Payer: Self-pay

## 2022-01-26 ENCOUNTER — Encounter: Payer: Self-pay | Admitting: Family Medicine

## 2022-01-26 LAB — MATERNIT 21 PLUS CORE, BLOOD
Fetal Fraction: 7
Result (T21): NEGATIVE
Trisomy 13 (Patau syndrome): NEGATIVE
Trisomy 18 (Edwards syndrome): NEGATIVE
Trisomy 21 (Down syndrome): NEGATIVE

## 2022-01-26 NOTE — Telephone Encounter (Signed)
Call to patient regarding scheduling next MH RV. Appointment now scheduled for 02/16/22 at 2:40pm arrival time. Patient verbalized understanding.   Earlyne Iba, RN

## 2022-02-16 ENCOUNTER — Ambulatory Visit: Payer: Medicaid Other | Admitting: Physician Assistant

## 2022-02-16 VITALS — BP 120/85 | HR 103 | Temp 98.4°F | Wt 192.6 lb

## 2022-02-16 DIAGNOSIS — O0992 Supervision of high risk pregnancy, unspecified, second trimester: Secondary | ICD-10-CM | POA: Diagnosis not present

## 2022-02-16 DIAGNOSIS — F1290 Cannabis use, unspecified, uncomplicated: Secondary | ICD-10-CM

## 2022-02-16 DIAGNOSIS — O09522 Supervision of elderly multigravida, second trimester: Secondary | ICD-10-CM

## 2022-02-16 DIAGNOSIS — O99212 Obesity complicating pregnancy, second trimester: Secondary | ICD-10-CM | POA: Diagnosis not present

## 2022-02-16 DIAGNOSIS — Z720 Tobacco use: Secondary | ICD-10-CM | POA: Diagnosis not present

## 2022-02-16 MED ORDER — ASPIRIN 81 MG PO TBEC: 81.0000 mg | DELAYED_RELEASE_TABLET | Freq: Every day | ORAL | Status: AC

## 2022-02-16 NOTE — Progress Notes (Signed)
Reminded of U/S 03/16/2021. Bthiele RN

## 2022-02-16 NOTE — Progress Notes (Signed)
[facility] Department Maternal Health Clinic  PRENATAL VISIT NOTE  Subjective:  Megan Bell is a 36 y.o. G3P2002 at [redacted]w[redacted]d being seen today for ongoing prenatal care.  She is currently monitored for the following issues for this high-risk pregnancy and has Advanced maternal age in multigravida 36 yo; Obesity affecting pregnancy BMI=34.5; Supervision of high risk pregnancy in second trimester; Vapes nicotine containing substance; Teeth missing; upper partial; and Marijuana use on their problem list.  Patient reports nausea.  Contractions: Not present. Vag. Bleeding: None.  Movement: Absent. Occ uses THC for nausea. Denies leaking of fluid/ROM.   The following portions of the patient's history were reviewed and updated as appropriate: allergies, current medications, past family history, past medical history, past social history, past surgical history and problem list. Problem list updated.  Objective:   Vitals:   02/16/22 1439  BP: 120/85  Pulse: (!) 103  Temp: 98.4 F (36.9 C)  Weight: 192 lb 9.6 oz (87.4 kg)    Fetal Status: Fetal Heart Rate (bpm): 152 Fundal Height: 17 cm Movement: Absent     General:  Alert, oriented and cooperative. Patient is in no acute distress.  Skin: Skin is warm and dry. No rash noted.   Cardiovascular: Normal heart rate noted  Respiratory: Normal respiratory effort, no problems with respiration noted  Abdomen: Soft, gravid, appropriate for gestational age.  Pain/Pressure: Absent     Pelvic: Cervical exam deferred        Extremities: Normal range of motion.  Edema: None  Mental Status: Normal mood and affect. Normal behavior. Normal judgment and thought content.   Assessment and Plan:  Pregnancy: G3P2002 at [redacted]w[redacted]d  1. Supervision of high risk pregnancy in second trimester Enc pt to keep fetal anat [place] as sched 03/16/21 (soonest avail).  2. Obesity affecting pregnancy in second trimester, unspecified obesity type Enc to take daily low-dose  aspirin. 12 lb wt loss thus far, drinking well, trouble eating at times due to nausea. Declines antiemetic. Has list of comfort measures, will try Sea Bands. - aspirin EC 81 MG tablet; Take 1 tablet (81 mg total) by mouth daily. Swallow whole.  3. Multigravida of advanced maternal age in second trimester Mat21 neg. Enc daily aspirin. - aspirin EC 81 MG tablet; Take 1 tablet (81 mg total) by mouth daily. Swallow whole.  4. Vapes nicotine containing substance Not using.  5. Marijuana use Discouraged use, "Plan of Safe Care" info given today.   Preterm labor symptoms and general obstetric precautions including but not limited to vaginal bleeding, contractions, leaking of fluid and fetal movement were reviewed in detail with the patient. Please refer to After Visit Summary for other counseling recommendations.  Return in about 4 weeks (around 03/16/2022) for Routine prenatal care.  Future Appointments  Date Time Provider Department Center  03/16/2022 10:00 AM [facility] PROVIDER [facility] None    Landry Dyke, PA-C

## 2022-02-20 NOTE — L&D Delivery Note (Signed)
Delivery Note   Megan Bell is a 37 y.o. G3P2002 at [redacted]w[redacted]d Estimated Date of Delivery: 08/10/22  PRE-OPERATIVE DIAGNOSIS:  1) [redacted]w[redacted]d pregnancy.  2) Marijuana use in pregnancy 3) Bilateral fetal clubfeet  POST-OPERATIVE DIAGNOSIS:  1) [redacted]w[redacted]d pregnancy s/p Vaginal, Spontaneous    Delivery Type: Vaginal, Spontaneous    Delivery Anesthesia: Epidural   Labor Complications:  none    ESTIMATED BLOOD LOSS: 400  ml    FINDINGS:   1) female infant, Apgar scores of 8   at 1 minute and 9   at 5 minutes and a birthweight pending per protocol.     SPECIMENS:   PLACENTA:   Appearance: Intact    Removal: Spontaneous      Disposition:  discarded  CORD BLOOD: Collected for blood type  DISPOSITION:  Infant left in stable condition in the delivery room, with L&D personnel and mother,  NARRATIVE SUMMARY: Labor course:  [name] is a [id number] at [redacted]w[redacted]d who presented to Labor & Delivery for induction of labor due to social indications and fetal clubfeet. Her initial cervical exam was 1/60/-2. Single dose misoprostol administered & Cook catheter placed. Pitocin initiated after Lisabeth Pick received an epidural for pain management. AROM for large amount clear fluid. Epidural catheter found to be out & cervical exam anterior lip at that time, [place] desired to trial pushing vs epidural replacement. Lip easily reduced, completely dilated at 0045.  With excellent maternal pushing effort, she birthed a viable female infant at 52. There was a nuchal cord, somersaulted & reduced after delivery. The shoulders were birthed without difficulty. The infant was placed skin-to-skin with mother. The cord was doubly clamped and cut when pulsations ceased. The placenta delivered spontaneously and was noted to be intact with a 3VC. A perineal and vaginal examination was performed. Episiotomy/Lacerations: 1st degree  Lacerations were repaired with Vicryl suture using epidural anesthesia. The patient tolerated  this well. Mother and baby were left in stable condition.   Dominica Severin, CNM 08/04/2022 1:39 AM

## 2022-03-16 ENCOUNTER — Ambulatory Visit: Payer: Medicaid Other

## 2022-03-28 ENCOUNTER — Encounter: Payer: Self-pay | Admitting: Obstetrics

## 2022-03-28 ENCOUNTER — Ambulatory Visit (INDEPENDENT_AMBULATORY_CARE_PROVIDER_SITE_OTHER): Payer: Medicaid Other | Admitting: Obstetrics

## 2022-03-28 VITALS — BP 119/80 | HR 88

## 2022-03-28 DIAGNOSIS — O0992 Supervision of high risk pregnancy, unspecified, second trimester: Secondary | ICD-10-CM

## 2022-03-28 DIAGNOSIS — O358XX0 Maternal care for other (suspected) fetal abnormality and damage, not applicable or unspecified: Secondary | ICD-10-CM

## 2022-03-28 DIAGNOSIS — O09522 Supervision of elderly multigravida, second trimester: Secondary | ICD-10-CM

## 2022-03-28 DIAGNOSIS — Z3A20 20 weeks gestation of pregnancy: Secondary | ICD-10-CM

## 2022-03-28 DIAGNOSIS — R809 Proteinuria, unspecified: Secondary | ICD-10-CM

## 2022-03-28 DIAGNOSIS — O99212 Obesity complicating pregnancy, second trimester: Secondary | ICD-10-CM

## 2022-03-28 LAB — POCT URINALYSIS DIPSTICK OB
Bilirubin, UA: NEGATIVE
Glucose, UA: NEGATIVE
Ketones, UA: NEGATIVE
Leukocytes, UA: NEGATIVE
Nitrite, UA: NEGATIVE
Spec Grav, UA: 1.01 (ref 1.010–1.025)
Urobilinogen, UA: 0.2 E.U./dL
pH, UA: 6.5 (ref 5.0–8.0)

## 2022-03-28 NOTE — Progress Notes (Signed)
NEW OB TRANSFER HISTORY AND PHYSICAL  SUBJECTIVE:       Megan Bell is a 37 y.o. G52P2002 female, Patient's last menstrual period was 10/09/2021 (within weeks)., Estimated Date of Delivery: 08/10/22, [redacted]w[redacted]d, presents today for establishment of Prenatal Care. She reports daily nausea and occasional vomiting.  Social history Partner/Relationship: FOB involved Living situation: lives with fiance and children. Feels safe. Work: [employer] Exercise: Occasional walking Substance use: +MJ on UDS 01/20/22   Gynecologic History Patient's last menstrual period was 10/09/2021 (within weeks). Normal Contraception: OCP (estrogen/progesterone) Last Pap: 08/2020. Results were: NILM, negative HPV  Obstetric History OB History  Gravida Para Term Preterm AB Living  3 2 2  0 0 2  SAB IAB Ectopic Multiple Live Births  0 0 0 0 2    # Outcome Date GA Lbr Len/2nd Weight Sex Delivery Anes PTL Lv  3 Current           2 Term 07/27/20 [redacted]w[redacted]d / 00:28 7 lb 6.9 oz (3.37 kg) M Vag-Spont EPI  LIV  1 Term 03/22/09 [redacted]w[redacted]d  6 lb 3 oz (2.807 kg) F Vag-Spont   LIV     Birth Comments: deceled once or twice during labor but baby okay    Past Medical History:  Diagnosis Date   Abnormal Pap smear of cervix 2011   didn't f/u   Anemia    "years ago"   Endometriosis 2011   of ovarian cyst during preg on u/s 2011   History of suicide attempt age 2 (OD on Advil) 01/20/2022    Past Surgical History:  Procedure Laterality Date   LAPAROSCOPIC CHOLECYSTECTOMY  2011   Dr. Jamal Collin   Removal of wisdom tooth x3     early 2023    Current Outpatient Medications on File Prior to Visit  Medication Sig Dispense Refill   aspirin EC 81 MG tablet Take 1 tablet (81 mg total) by mouth daily. Swallow whole.     Prenatal Vit-Fe Fumarate-FA (MULTIVITAMIN-PRENATAL) 27-0.8 MG TABS tablet Take 1 tablet by mouth daily at 12 noon. 100 tablet 0   No current facility-administered medications on file prior to visit.    No Known  Allergies  Social History   Socioeconomic History   Marital status: Significant Other    Spouse name: Deborah Chalk   Number of children: 2   Years of education: 88 - pharmacy tech certificate (recently expired - no funds to renew per client)   Highest education level: High school graduate  Occupational History   Occupation: homemaker  Tobacco Use   Smoking status: Former    Packs/day: 1.00    Years: 10.00    Total pack years: 10.00    Types: Cigarettes    Quit date: 2022    Years since quitting: 2.0    Passive exposure: Past   Smokeless tobacco: Never  Vaping Use   Vaping Use: Some days   Substances: Nicotine, Flavoring  Substance and Sexual Activity   Alcohol use: Not Currently    Comment: Last use early 11/2021.   Drug use: Not Currently    Types: Marijuana    Comment: Last use 11/2021- before positive hone UPT.   Sexual activity: Yes    Birth control/protection: Pill    Comment: Stopped ocp 11/2021 when found out pregnant.  Other Topics Concern   Not on file  Social History Narrative   Not on file   Social Determinants of Health   Financial Resource Strain: Medium Risk (01/20/2022)   Overall  Financial Resource Strain (CARDIA)    Difficulty of Paying Living Expenses: Somewhat hard  Food Insecurity: Food Insecurity Present (01/20/2022)   Hunger Vital Sign    Worried About Running Out of Food in the Last Year: Sometimes true    Ran Out of Food in the Last Year: Sometimes true  Transportation Needs: No Transportation Needs (01/20/2022)   PRAPARE - Hydrologist (Medical): No    Lack of Transportation (Non-Medical): No  Physical Activity: Not on file  Stress: Not on file  Social Connections: Not on file  Intimate Partner Violence: Not At Risk (01/20/2022)   Humiliation, Afraid, Rape, and Kick questionnaire    Fear of Current or Ex-Partner: No    Emotionally Abused: No    Physically Abused: No    Sexually Abused: No    Family History   Problem Relation Age of Onset   Alcohol abuse Mother    Kidney Stones Father    Ovarian cancer Maternal Grandmother    Diabetes Maternal Grandfather    Heart disease Maternal Grandfather        triple bypass   Lymphoma Maternal Grandfather    Breast cancer Paternal Grandmother 36   Fibrocystic breast disease Paternal Grandmother    Obesity Paternal Grandfather    Healthy Daughter    Healthy Son    Healthy Half-Brother     The following portions of the patient's history were reviewed and updated as appropriate: allergies, current medications, past OB history, past medical history, past surgical history, past family history, past social history, and problem list.  Constitutional: Denied constitutional symptoms, night sweats, recent illness, fatigue, fever, insomnia and weight loss.  Eyes: Denied eye symptoms, eye pain, photophobia, vision change and visual disturbance.  Ears/Nose/Throat/Neck: Denied ear, nose, throat or neck symptoms, hearing loss, nasal discharge, sinus congestion and sore throat.  Cardiovascular: Denied cardiovascular symptoms, arrhythmia, chest pain/pressure, edema, exercise intolerance, orthopnea and palpitations.  Respiratory: Denied pulmonary symptoms, asthma, pleuritic pain, productive sputum, cough, dyspnea and wheezing.  Gastrointestinal: Denied, gastro-esophageal reflux, melena. +N/V  Genitourinary: Denied genitourinary symptoms including symptomatic vaginal discharge, pelvic relaxation issues, and urinary complaints. +SP dysfunction  Musculoskeletal: Denied musculoskeletal symptoms, stiffness, swelling, muscle weakness and myalgia.  Dermatologic: Denied dermatology symptoms, rash and scar.  Neurologic: Denied neurology symptoms, dizziness, headache, neck pain and syncope.  Psychiatric: Denied psychiatric symptoms, anxiety and depression.  Endocrine: Denied endocrine symptoms including hot flashes and night sweats.     OBJECTIVE: Initial Physical Exam (New  OB)  GENERAL APPEARANCE: alert, well appearing HEAD: normocephalic, atraumatic MOUTH: mucous membranes moist, pharynx normal without lesions THYROID: no thyromegaly or masses present BREASTS: no masses noted, no significant tenderness, no palpable axillary nodes, no skin changes LUNGS: clear to auscultation, no wheezes, rales or rhonchi, symmetric air entry HEART: regular rate and rhythm, no murmurs ABDOMEN: soft, nontender, nondistended, no abnormal masses, no epigastric pain, fundus soft, nontender 20 cm, and FHT present (140) EXTREMITIES: no redness or tenderness in the calves or thighs SKIN: normal coloration and turgor, no rashes LYMPH NODES: no adenopathy palpable NEUROLOGIC: alert, oriented, normal speech, no focal findings or movement disorder noted  PELVIC EXAM declined  ASSESSMENT: Normal pregnancy [redacted]w[redacted]d  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 We discussed an overview of prenatal care and when to call. Reviewed diet, exercise, and weight gain recommendations in pregnancy. She it taking ASA 81 mg. Discussed benefits of breastfeeding and lactation resources at [facility]. I reviewed labs and answered all  questions. Asyria has f/u with [facility] for growth scan at 28 weeks and will follow with pediatric orthopedist. She is considering her options for permanent sterilization. Will schedule next visit with MD.  See orders  Lloyd Huger, CNM

## 2022-03-29 ENCOUNTER — Encounter: Payer: Self-pay | Admitting: Advanced Practice Midwife

## 2022-03-29 DIAGNOSIS — Z822 Family history of deafness and hearing loss: Secondary | ICD-10-CM | POA: Insufficient documentation

## 2022-03-29 DIAGNOSIS — Q6689 Other  specified congenital deformities of feet: Secondary | ICD-10-CM | POA: Insufficient documentation

## 2022-03-31 LAB — URINE CULTURE

## 2022-05-02 ENCOUNTER — Ambulatory Visit (INDEPENDENT_AMBULATORY_CARE_PROVIDER_SITE_OTHER): Payer: Medicaid Other | Admitting: Obstetrics and Gynecology

## 2022-05-02 ENCOUNTER — Encounter: Payer: Self-pay | Admitting: Obstetrics and Gynecology

## 2022-05-02 VITALS — BP 107/68 | HR 85 | Wt 197.7 lb

## 2022-05-02 DIAGNOSIS — Z3A25 25 weeks gestation of pregnancy: Secondary | ICD-10-CM

## 2022-05-02 DIAGNOSIS — Z308 Encounter for other contraceptive management: Secondary | ICD-10-CM

## 2022-05-02 DIAGNOSIS — O0992 Supervision of high risk pregnancy, unspecified, second trimester: Secondary | ICD-10-CM

## 2022-05-02 DIAGNOSIS — O99212 Obesity complicating pregnancy, second trimester: Secondary | ICD-10-CM

## 2022-05-02 DIAGNOSIS — O09529 Supervision of elderly multigravida, unspecified trimester: Secondary | ICD-10-CM

## 2022-05-02 DIAGNOSIS — Q6689 Other  specified congenital deformities of feet: Secondary | ICD-10-CM

## 2022-05-02 DIAGNOSIS — Z8041 Family history of malignant neoplasm of ovary: Secondary | ICD-10-CM

## 2022-05-02 DIAGNOSIS — O09522 Supervision of elderly multigravida, second trimester: Secondary | ICD-10-CM

## 2022-05-02 HISTORY — DX: Family history of malignant neoplasm of ovary: Z80.41

## 2022-05-02 LAB — POCT URINALYSIS DIPSTICK OB
Bilirubin, UA: NEGATIVE
Blood, UA: NEGATIVE
Glucose, UA: NEGATIVE
Ketones, UA: NEGATIVE
Leukocytes, UA: NEGATIVE
Nitrite, UA: NEGATIVE
POC,PROTEIN,UA: NEGATIVE
Spec Grav, UA: <=1.005 — AB (ref 1.010–1.025)
Urobilinogen, UA: NEGATIVE E.U./dL — AB
pH, UA: 7 (ref 5.0–8.0)

## 2022-05-02 NOTE — Progress Notes (Signed)
ROB 108w5d She is doing well. She has good fetal movement. Would like to discuss tubal ligation and completely removing the tubes. She has a family history of ovarian cancer and she heard that removing the tubes could lessen the chance of getting ovarian cancer. She would also like to discuss scheduling an induction so they can have childcare prepared for toddler at home.

## 2022-05-02 NOTE — Progress Notes (Signed)
ROB: Patient is a 37 y.o. 66w5dG3P2002 at 256w5dho presents for OB care. Pregnancy complicated by AMA status, also with bilateral club [facility] [facility].  Notes good fetal movement, denies LOF, contractions, vaginal bleeding. Desires to discuss BTL for contraception. Aware of all other forms of contraception and has used several different methods in the past. Patient also notes family history of ovarian cancer, recently diagnosed in a grandmother last year, who also passed ~ 6 months later. Desires salpingectomy.  Counseled on all forms of contraception, including sterilization. Also notes partner is getting vasectomy. Medicaid BTL form signed today.     Patient also desires to discuss elective IOL. Notes that she usually does not care for IOL but will likely have issues with childcare/home care as she has an almost 2 year at home and several animals. [place] if she spontaneously goes into labor  (especially at night).  Discussed that IOL could be considered at 39 weeks due to social issues. Can discuss further at future visits.  RTC in 4 weeks, for 28 week labs at that time.

## 2022-05-29 ENCOUNTER — Ambulatory Visit (INDEPENDENT_AMBULATORY_CARE_PROVIDER_SITE_OTHER): Payer: Medicaid Other | Admitting: Licensed Practical Nurse

## 2022-05-29 ENCOUNTER — Other Ambulatory Visit: Payer: Medicaid Other

## 2022-05-29 ENCOUNTER — Encounter: Payer: Self-pay | Admitting: Licensed Practical Nurse

## 2022-05-29 VITALS — BP 110/69 | HR 81 | Wt 204.8 lb

## 2022-05-29 DIAGNOSIS — Z3A29 29 weeks gestation of pregnancy: Secondary | ICD-10-CM

## 2022-05-29 DIAGNOSIS — Z3483 Encounter for supervision of other normal pregnancy, third trimester: Secondary | ICD-10-CM

## 2022-05-29 DIAGNOSIS — N393 Stress incontinence (female) (male): Secondary | ICD-10-CM

## 2022-05-29 DIAGNOSIS — Z3480 Encounter for supervision of other normal pregnancy, unspecified trimester: Secondary | ICD-10-CM

## 2022-05-29 DIAGNOSIS — Z23 Encounter for immunization: Secondary | ICD-10-CM

## 2022-05-29 LAB — POCT URINALYSIS DIPSTICK
Bilirubin, UA: NEGATIVE
Blood, UA: NEGATIVE
Glucose, UA: NEGATIVE
Ketones, UA: NEGATIVE
Leukocytes, UA: NEGATIVE
Nitrite, UA: NEGATIVE
Protein, UA: NEGATIVE
Spec Grav, UA: 1.01 (ref 1.010–1.025)
Urobilinogen, UA: 1 E.U./dL
pH, UA: 6.5 (ref 5.0–8.0)

## 2022-05-29 NOTE — Progress Notes (Signed)
Routine Prenatal Care Visit  Subjective  Megan Bell is a 37 y.o. G3P2002 at [redacted]w[redacted]d being seen today for ongoing prenatal care.  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  ----------------------------------------------------------------------------------- Patient reports fatigue, no bleeding, no contractions, no cramping, and no leaking.   Contractions: Not present. Vag. Bleeding: None.  Movement: Present. Leaking Fluid denies.  ----------------------------------------------------------------------------------- The following portions of the patient's history were reviewed and updated as appropriate: allergies, current medications, past family history, past medical history, past social history, past surgical history and problem list. Problem list updated.  Objective  Blood pressure 110/69, pulse 81, weight 204 lb 12.8 oz (92.9 kg), last menstrual period 10/09/2021, not currently breastfeeding. Pregravid weight 205 lb (93 kg) Total Weight Gain -3.2 oz (-0.091 kg) Urinalysis: Urine Protein    Urine Glucose    Fetal Status: Fetal Heart Rate (bpm): 135 Fundal Height: 28 cm Movement: Present     General:  Alert, oriented and cooperative. Patient is in no acute distress.  Skin: Skin is warm and dry. No rash noted.   Cardiovascular: Normal heart rate noted  Respiratory: Normal respiratory effort, no problems with respiration noted  Abdomen: Soft, gravid, appropriate for gestational age. Pain/Pressure: Present     Pelvic:  Cervical exam deferred         Extremities: Normal range of motion.  Edema: Trace  Mental Status: Normal mood and affect. Normal behavior. Normal judgment and thought content.   Assessment   37 y.o. [id number] at [redacted]w[redacted]d by  08/10/2022, by Ultrasound presenting for routine prenatal visit  Plan   Zarrella, Dai Problems (from 01/20/22 to present)     No problems associated with this episode.        Preterm labor symptoms and general obstetric precautions including but not limited to vaginal bleeding, contractions, leaking of fluid and fetal movement were reviewed in detail with the patient. Discussed labs today and patient agrees to have Tdap shot. Desires scheduled IOL due to childcare availability. Wants to have a BTL postpartum; husband is also getting a vasectomy. Patient wants to breastfeed, although she has struggled in the past (tongue-tie in oldest child, decreased rooting/suckling reflex in youngest child) she is motivated to breastfeed for the first year of baby's life. Patient is also interested in pelvic floor therapy as she has experienced incontinence since she had her first child; incontinence increases in pregnancy. States that her mood is good.   Patient seen by SNM, Earlie Counts and Carie Caddy, CNM.   Please refer to After Visit Summary for other counseling recommendations.   Return in about 2 weeks (around 06/12/2022) for ROB. TDAP today   Carie Caddy, CNM  [facility]  05/29/22  10:13 AM

## 2022-05-29 NOTE — Addendum Note (Signed)
Addended by: Carie Caddy on: 05/29/2022 12:18 PM   Modules accepted: Orders

## 2022-05-29 NOTE — Addendum Note (Signed)
Addended by: Burtis Junes on: 05/29/2022 10:37 AM   Modules accepted: Orders

## 2022-05-30 LAB — 28 WEEK RH+PANEL
Basophils Absolute: 0 10*3/uL (ref 0.0–0.2)
Basos: 0 %
EOS (ABSOLUTE): 0.2 10*3/uL (ref 0.0–0.4)
Eos: 1 %
Gestational Diabetes Screen: 103 mg/dL (ref 70–139)
HIV Screen 4th Generation wRfx: NONREACTIVE
Hematocrit: 33.8 % — ABNORMAL LOW (ref 34.0–46.6)
Hemoglobin: 11.1 g/dL (ref 11.1–15.9)
Immature Grans (Abs): 0.1 10*3/uL (ref 0.0–0.1)
Immature Granulocytes: 1 %
Lymphocytes Absolute: 2.8 10*3/uL (ref 0.7–3.1)
Lymphs: 22 %
MCH: 31.7 pg (ref 26.6–33.0)
MCHC: 32.8 g/dL (ref 31.5–35.7)
MCV: 97 fL (ref 79–97)
Monocytes Absolute: 0.6 10*3/uL (ref 0.1–0.9)
Monocytes: 4 %
Neutrophils Absolute: 9.3 10*3/uL — ABNORMAL HIGH (ref 1.4–7.0)
Neutrophils: 72 %
Platelets: 349 10*3/uL (ref 150–450)
RBC: 3.5 x10E6/uL — ABNORMAL LOW (ref 3.77–5.28)
RDW: 12.7 % (ref 11.7–15.4)
RPR Ser Ql: NONREACTIVE
WBC: 13 10*3/uL — ABNORMAL HIGH (ref 3.4–10.8)

## 2022-06-15 ENCOUNTER — Ambulatory Visit (INDEPENDENT_AMBULATORY_CARE_PROVIDER_SITE_OTHER): Payer: Medicaid Other | Admitting: Obstetrics and Gynecology

## 2022-06-15 ENCOUNTER — Encounter: Payer: Self-pay | Admitting: Obstetrics and Gynecology

## 2022-06-15 VITALS — BP 106/72 | HR 78 | Wt 208.9 lb

## 2022-06-15 DIAGNOSIS — Z3480 Encounter for supervision of other normal pregnancy, unspecified trimester: Secondary | ICD-10-CM

## 2022-06-15 DIAGNOSIS — Z3483 Encounter for supervision of other normal pregnancy, third trimester: Secondary | ICD-10-CM

## 2022-06-15 DIAGNOSIS — Z3A32 32 weeks gestation of pregnancy: Secondary | ICD-10-CM

## 2022-06-15 LAB — POCT URINALYSIS DIPSTICK OB
Bilirubin, UA: NEGATIVE
Blood, UA: NEGATIVE
Glucose, UA: NEGATIVE
Ketones, UA: NEGATIVE
Leukocytes, UA: NEGATIVE
Nitrite, UA: NEGATIVE
POC,PROTEIN,UA: NEGATIVE
Spec Grav, UA: <=1.005 — AB (ref 1.010–1.025)
Urobilinogen, UA: 0.2 E.U./dL
pH, UA: 6 (ref 5.0–8.0)

## 2022-06-15 NOTE — Progress Notes (Signed)
ROB: Patient reports daily movement.  Has no complaints or issues.  To begin NSTs at 36 weeks per MFM for AMA.  Would like to delay permanent sterilization/salpingectomy until at least 8 weeks postpartum.

## 2022-06-15 NOTE — Progress Notes (Signed)
ROB. Patient states fetal movement and increased pelvic pressure. She reports wanting to breastfeed postpartum but would like to discuss salpingectomy. 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

## 2022-06-26 ENCOUNTER — Encounter: Payer: Self-pay | Admitting: Obstetrics

## 2022-06-26 ENCOUNTER — Ambulatory Visit (INDEPENDENT_AMBULATORY_CARE_PROVIDER_SITE_OTHER): Payer: Medicaid Other | Admitting: Obstetrics

## 2022-06-26 VITALS — BP 117/81 | HR 98 | Wt 207.0 lb

## 2022-06-26 DIAGNOSIS — Z3A33 33 weeks gestation of pregnancy: Secondary | ICD-10-CM

## 2022-06-26 DIAGNOSIS — O0993 Supervision of high risk pregnancy, unspecified, third trimester: Secondary | ICD-10-CM

## 2022-06-26 LAB — POCT URINALYSIS DIPSTICK OB
Bilirubin, UA: NEGATIVE
Blood, UA: NEGATIVE
Clarity, UA: NEGATIVE
Glucose, UA: NEGATIVE
Ketones, UA: NEGATIVE
Leukocytes, UA: NEGATIVE
Nitrite, UA: NEGATIVE
POC,PROTEIN,UA: NEGATIVE
Spec Grav, UA: 1.015 (ref 1.010–1.025)
Urobilinogen, UA: 0.2 E.U./dL
pH, UA: 6.5 (ref 5.0–8.0)

## 2022-06-26 NOTE — Patient Instructions (Signed)
Third Trimester of Pregnancy  The third trimester of pregnancy is from week 28 through week 40. This is months 7 through 9. The third trimester is a time when the unborn baby (fetus) is growing rapidly. At the end of the ninth month, the fetus is about 20 inches long and weighs 6-10 pounds. Body changes during your third trimester During the third trimester, your body will continue to go through many changes. The changes vary and generally return to normal after your baby is born. Physical changes Your weight will continue to increase. You can expect to gain 25-35 pounds (11-16 kg) by the end of the pregnancy if you begin pregnancy at a normal weight. If you are underweight, you can expect to gain 28-40 lb (about 13-18 kg), and if you are overweight, you can expect to gain 15-25 lb (about 7-11 kg). You may begin to get stretch marks on your hips, abdomen, and breasts. Your breasts will continue to grow and may hurt. A yellow fluid (colostrum) may leak from your breasts. This is the first milk you are producing for your baby. You may have changes in your hair. These can include thickening of your hair, rapid growth, and changes in texture. Some people also have hair loss during or after pregnancy, or hair that feels dry or thin. Your belly button may stick out. You may notice more swelling in your hands, face, or ankles. Health changes You may have heartburn. You may have constipation. You may develop hemorrhoids. You may develop swollen, bulging veins (varicose veins) in your legs. You may have increased body aches in the pelvis, back, or thighs. This is due to weight gain and increased hormones that are relaxing your joints. You may have increased tingling or numbness in your hands, arms, and legs. The skin on your abdomen may also feel numb. You may feel short of breath because of your expanding uterus. Other changes You may urinate more often because the fetus is moving lower into your pelvis  and pressing on your bladder. You may have more problems sleeping. This may be caused by the size of your abdomen, an increased need to urinate, and an increase in your body's metabolism. You may notice the fetus "dropping," or moving lower in your abdomen (lightening). You may have increased vaginal discharge. You may notice that you have pain around your pelvic bone as your uterus distends. Follow these instructions at home: Medicines Follow your health care provider's instructions regarding medicine use. Specific medicines may be either safe or unsafe to take during pregnancy. Do not take any medicines unless approved by your health care provider. Take a prenatal vitamin that contains at least 600 micrograms (mcg) of folic acid. Eating and drinking Eat a healthy diet that includes fresh fruits and vegetables, whole grains, good sources of protein such as meat, eggs, or tofu, and low-fat dairy products. Avoid raw meat and unpasteurized juice, milk, and cheese. These carry germs that can harm you and your baby. Eat 4 or 5 small meals rather than 3 large meals a day. You may need to take these actions to prevent or treat constipation: Drink enough fluid to keep your urine pale yellow. Eat foods that are high in fiber, such as beans, whole grains, and fresh fruits and vegetables. Limit foods that are high in fat and processed sugars, such as fried or sweet foods. Activity Exercise only as directed by your health care provider. Most people can continue their usual exercise routine during pregnancy. Try   to exercise for 30 minutes at least 5 days a week. Stop exercising if you experience contractions in the uterus. Stop exercising if you develop pain or cramping in the lower abdomen or lower back. Avoid heavy lifting. Do not exercise if it is very hot or humid or if you are at a high altitude. If you choose to, you may continue to have sex unless your health care provider tells you not  to. Relieving pain and discomfort Take frequent breaks and rest with your legs raised (elevated) if you have leg cramps or low back pain. Take warm sitz baths to soothe any pain or discomfort caused by hemorrhoids. Use hemorrhoid cream if your health care provider approves. Wear a supportive bra to prevent discomfort from breast tenderness. If you develop varicose veins: Wear support hose as told by your health care provider. Elevate your feet for 15 minutes, 3-4 times a day. Limit salt in your diet. Safety Talk to your health care provider before traveling far distances. Do not use hot tubs, steam rooms, or saunas. Wear your seat belt at all times when driving or riding in a car. Talk with your health care provider if someone is verbally or physically abusive to you. Preparing for birth To prepare for the arrival of your baby: Take prenatal classes to understand, practice, and ask questions about labor and delivery. Visit the hospital and tour the maternity area. Purchase a rear-facing car seat and make sure you know how to install it in your car. Prepare the baby's room or sleeping area. Make sure to remove all pillows and stuffed animals from the baby's crib to prevent suffocation. General instructions Avoid cat litter boxes and soil used by cats. These carry germs that can cause birth defects in the baby. If you have a cat, ask someone to clean the litter box for you. Do not douche or use tampons. Do not use scented sanitary pads. Do not use any products that contain nicotine or tobacco, such as cigarettes, e-cigarettes, and chewing tobacco. If you need help quitting, ask your health care provider. Do not use any herbal remedies, illegal drugs, or medicines that were not prescribed to you. Chemicals in these products can harm your baby. Do not drink alcohol. You will have more frequent prenatal exams during the third trimester. During a routine prenatal visit, your health care provider  will do a physical exam, perform tests, and discuss your overall health. Keep all follow-up visits. This is important. Where to find more information American Pregnancy Association: americanpregnancy.org American College of Obstetricians and Gynecologists: acog.org/en/Womens%20Health/Pregnancy Office on Women's Health: womenshealth.gov/pregnancy Contact a health care provider if you have: A fever. Mild pelvic cramps, pelvic pressure, or nagging pain in your abdominal area or lower back. Vomiting or diarrhea. Bad-smelling vaginal discharge or foul-smelling urine. Pain when you urinate. A headache that does not go away when you take medicine. Visual changes or see spots in front of your eyes. Get help right away if: Your water breaks. You have regular contractions less than 5 minutes apart. You have spotting or bleeding from your vagina. You have severe abdominal pain. You have difficulty breathing. You have chest pain. You have fainting spells. You have not felt your baby move for the time period told by your health care provider. You have new or increased pain, swelling, or redness in an arm or leg. Summary The third trimester of pregnancy is from week 28 through week 40 (months 7 through 9). You may have more problems   sleeping. This can be caused by the size of your abdomen, an increased need to urinate, and an increase in your body's metabolism. You will have more frequent prenatal exams during the third trimester. Keep all follow-up visits. This is important. This information is not intended to replace advice given to you by your health care provider. Make sure you discuss any questions you have with your health care provider. Document Revised: 07/16/2019 Document Reviewed: 05/22/2019 Elsevier Patient Education  2023 Elsevier Inc.  Group B Streptococcus Infection During Pregnancy Group B Streptococcus (GBS) is a type of bacteria that is often found in healthy people. It is  commonly found in the rectum, vagina, and intestines. In people who are healthy and not pregnant, the bacteria rarely cause serious illness or complications. However, women who test positive for GBS during pregnancy can pass the bacteria to the baby during childbirth. This can cause serious infection in the baby after birth. Women with GBS may also have infections during their pregnancy or soon after childbirth. The infections include urinary tract infections (UTIs) or infections of the uterus. GBS also increases a woman's risk of complications during pregnancy, such as early labor or delivery, miscarriage, or stillbirth. Routine testing for GBS is recommended for all pregnant women. What are the causes? This condition is caused by bacteria called Streptococcus agalactiae. What increases the risk? You may have a higher risk for GBS infection during pregnancy if you had one during a past pregnancy. What are the signs or symptoms? In most cases, GBS infection does not cause symptoms in pregnant women. If symptoms exist, they may include: Labor that starts before the 37th week of pregnancy. A UTI or bladder infection. This may cause a fever, frequent urination, or pain and burning during urination. Fever during labor. There can also be a rapid heartbeat in the mother or baby. Rare but serious symptoms of a GBS infection in women include: Blood infection (septicemia). This may cause fever, chills, or confusion. Lung infection (pneumonia). This may cause fever, chills, cough, rapid breathing, chest pain, or difficulty breathing. Bone, joint, skin, or soft tissue infection. How is this diagnosed? You may be screened for GBS between week 35 and week 37 of pregnancy. If you have symptoms of preterm labor, you may be screened earlier. This condition is diagnosed based on lab test results from: A swab of fluid from the vagina and rectum. A urine sample. How is this treated? This condition is treated with  antibiotic medicine. Antibiotic medicine may be given: To you when you go into labor, or as soon as your water breaks. The medicines will continue until after you give birth. If you are having a cesarean delivery, you do not need antibiotics unless your water has broken. To your baby, if he or she requires treatment. Your health care provider will check your baby to decide if he or she needs antibiotics to prevent a serious infection. Follow these instructions at home: Take over-the-counter and prescription medicines only as told by your health care provider. Take your antibiotic medicine as told by your health care provider. Do not stop taking the antibiotic even if you start to feel better. Keep all pre-birth (prenatal) visits and follow-up visits as told by your health care provider. This is important. Contact a health care provider if: You have pain or burning when you urinate. You have to urinate more often than usual. You have a fever or chills. You develop a bad-smelling vaginal discharge. Get help right away if:  Your water breaks. You go into labor. You have severe pain in your abdomen. You have difficulty breathing. You have chest pain. These symptoms may represent a serious problem that is an emergency. Do not wait to see if the symptoms will go away. Get medical help right away. Call your local emergency services (911 in the [place] Do not drive yourself to the hospital. Summary GBS is a type of bacteria that is common in healthy people. During pregnancy, colonization with GBS can cause serious complications for you or your baby. Your health care provider will screen you between 35 and 37 weeks of pregnancy to determine if you are colonized with GBS. If you are colonized with GBS during pregnancy, your health care provider will recommend antibiotics through an IV during labor. After delivery, your baby will be evaluated for complications related to potential GBS infection and may  require antibiotics to prevent a serious infection. This information is not intended to replace advice given to you by your health care provider. Make sure you discuss any questions you have with your health care provider. Document Revised: 12/09/2019 Document Reviewed: 09/02/2018 Elsevier Patient Education  2023 [employer].

## 2022-06-26 NOTE — Progress Notes (Signed)
Subjective:    Megan Bell is a 37 y.o. [id number] [redacted]w[redacted]d being seen today for her obstetrical visit.  Patient reports no complaints. Fetal movement: normal.   Objective:    BP 117/81   Pulse 98   Wt 207 lb (93.9 kg)   LMP 10/09/2021 (Within Weeks) Comment: stopped ocp when had +home UPT on 12/02/21  BMI 36.67 kg/m   Physical Exam Vitals and nursing note reviewed.  Constitutional:      Appearance: Normal appearance.  Cardiovascular:     Rate and Rhythm: Normal rate.  Pulmonary:     Effort: Pulmonary effort is normal.  Skin:    General: Skin is warm and dry.  Neurological:     General: No focal deficit present.     Mental Status: She is alert and oriented to person, place, and time.  Psychiatric:        Mood and Affect: Mood normal.        Behavior: Behavior normal.       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 NSTs starting at 36w for prenatal diagnosis of bilateral clubfoot per MFM, Lisabeth Pick aware and in agreement.  Follow up in 2 Weeks.

## 2022-07-10 ENCOUNTER — Other Ambulatory Visit (HOSPITAL_COMMUNITY)
Admission: RE | Admit: 2022-07-10 | Discharge: 2022-07-10 | Disposition: A | Discharge status: Home / Self Care | Payer: Medicaid Other | Source: Ambulatory Visit | Attending: Obstetrics | Admitting: Obstetrics

## 2022-07-10 ENCOUNTER — Ambulatory Visit (INDEPENDENT_AMBULATORY_CARE_PROVIDER_SITE_OTHER): Payer: Medicaid Other | Admitting: Obstetrics

## 2022-07-10 VITALS — BP 99/66 | HR 95 | Wt 208.0 lb

## 2022-07-10 DIAGNOSIS — Z3A35 35 weeks gestation of pregnancy: Secondary | ICD-10-CM

## 2022-07-10 DIAGNOSIS — Z113 Encounter for screening for infections with a predominantly sexual mode of transmission: Secondary | ICD-10-CM | POA: Insufficient documentation

## 2022-07-10 DIAGNOSIS — O0993 Supervision of high risk pregnancy, unspecified, third trimester: Secondary | ICD-10-CM | POA: Insufficient documentation

## 2022-07-10 DIAGNOSIS — Z3483 Encounter for supervision of other normal pregnancy, third trimester: Secondary | ICD-10-CM

## 2022-07-10 DIAGNOSIS — O0992 Supervision of high risk pregnancy, unspecified, second trimester: Secondary | ICD-10-CM

## 2022-07-10 DIAGNOSIS — Z3685 Encounter for antenatal screening for Streptococcus B: Secondary | ICD-10-CM

## 2022-07-10 LAB — POCT URINALYSIS DIPSTICK OB
Bilirubin, UA: NEGATIVE
Blood, UA: NEGATIVE
Glucose, UA: NEGATIVE
Ketones, UA: NEGATIVE
Leukocytes, UA: NEGATIVE
Nitrite, UA: NEGATIVE
POC,PROTEIN,UA: NEGATIVE
Spec Grav, UA: 1.01 (ref 1.010–1.025)
Urobilinogen, UA: 0.2 E.U./dL
pH, UA: 6 (ref 5.0–8.0)

## 2022-07-10 NOTE — Patient Instructions (Signed)

## 2022-07-10 NOTE — Progress Notes (Signed)
Routine Prenatal Care Visit  Subjective  Megan Bell is a 37 y.o. G3P2002 at [redacted]w[redacted]d being seen today for ongoing prenatal care.  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  ----------------------------------------------------------------------------------- Patient reports no complaints.   Contractions: Irregular. Vag. Bleeding: None.  Movement: Present. Leaking Fluid denies.  ----------------------------------------------------------------------------------- The following portions of the patient's history were reviewed and updated as appropriate: allergies, current medications, past family history, past medical history, past social history, past surgical history and problem list. Problem list updated.  Objective  Blood pressure 99/66, pulse 95, weight 208 lb (94.3 kg), last menstrual period 10/09/2021, not currently breastfeeding. Pregravid weight 205 lb (93 kg) Total Weight Gain 3 lb (1.361 kg) Urinalysis: Urine Protein Negative  Urine Glucose Negative  Fetal Status:     Movement: Present     General:  Alert, oriented and cooperative. Patient is in no acute distress.  Skin: Skin is warm and dry. No rash noted.   Cardiovascular: Normal heart rate noted  Respiratory: Normal respiratory effort, no problems with respiration noted  Abdomen: Soft, gravid, appropriate for gestational age. Pain/Pressure: Present     Pelvic:  Cervical exam deferred        Extremities: Normal range of motion.     Mental Status: Normal mood and affect. Normal behavior. Normal  judgment and thought content.   Assessment   37 y.o. [id number] at [redacted]w[redacted]d by  08/10/2022, by Ultrasound presenting for routine prenatal visit  Plan   Cusic, Phyliss Problems (from 01/20/22 to present)     No problems associated with this episode.        Preterm labor symptoms and general obstetric precautions including but not limited to vaginal bleeding, contractions, leaking of fluid and fetal movement were reviewed in detail with the patient. Please refer to After Visit Summary for other counseling recommendations.  GBS, cultures done today.  No follow-ups on file.  Mirna [name], CNM  07/10/2022 10:13 AM

## 2022-07-11 LAB — CERVICOVAGINAL ANCILLARY ONLY
Chlamydia: NEGATIVE
Comment: NEGATIVE
Comment: NORMAL
Neisseria Gonorrhea: NEGATIVE

## 2022-07-12 ENCOUNTER — Telehealth: Payer: Self-pay

## 2022-07-12 LAB — STREP GP B NAA: Strep Gp B NAA: NEGATIVE

## 2022-07-12 NOTE — Telephone Encounter (Signed)
Left voicemail to notify patient of need for her 8:15 NST appointment of 5/28 to be rescheduled to 8:45 (closer to the time of her appointment with provider for prenatal visit @9 :55. Advised will send my chart message as well. Requested patient confirm.

## 2022-07-18 ENCOUNTER — Ambulatory Visit (INDEPENDENT_AMBULATORY_CARE_PROVIDER_SITE_OTHER): Payer: Medicaid Other | Admitting: Certified Nurse Midwife

## 2022-07-18 ENCOUNTER — Other Ambulatory Visit: Payer: Medicaid Other

## 2022-07-18 ENCOUNTER — Ambulatory Visit (INDEPENDENT_AMBULATORY_CARE_PROVIDER_SITE_OTHER): Payer: Medicaid Other

## 2022-07-18 VITALS — BP 130/70 | HR 94 | Ht 63.0 in | Wt 206.6 lb

## 2022-07-18 VITALS — BP 130/70 | HR 94 | Wt 206.6 lb

## 2022-07-18 DIAGNOSIS — O0992 Supervision of high risk pregnancy, unspecified, second trimester: Secondary | ICD-10-CM

## 2022-07-18 DIAGNOSIS — O358XX0 Maternal care for other (suspected) fetal abnormality and damage, not applicable or unspecified: Secondary | ICD-10-CM | POA: Diagnosis not present

## 2022-07-18 DIAGNOSIS — Z3A36 36 weeks gestation of pregnancy: Secondary | ICD-10-CM | POA: Diagnosis not present

## 2022-07-18 DIAGNOSIS — O0993 Supervision of high risk pregnancy, unspecified, third trimester: Secondary | ICD-10-CM

## 2022-07-18 DIAGNOSIS — Q6689 Other  specified congenital deformities of feet: Secondary | ICD-10-CM

## 2022-07-18 LAB — POCT URINALYSIS DIPSTICK
Bilirubin, UA: NEGATIVE
Blood, UA: NEGATIVE
Glucose, UA: NEGATIVE
Ketones, UA: NEGATIVE
Leukocytes, UA: NEGATIVE
Nitrite, UA: NEGATIVE
Protein, UA: NEGATIVE
Spec Grav, UA: 1.02 (ref 1.010–1.025)
Urobilinogen, UA: 0.2 E.U./dL
pH, UA: 7.5 (ref 5.0–8.0)

## 2022-07-18 NOTE — Progress Notes (Signed)
    NURSE VISIT NOTE  Subjective:    Patient ID: Megan Bell, female    DOB: 06-Aug-1985, 37 y.o.   MRN: [id number]  HPI  Patient is a 37 y.o. G22P2002 female who presents for fetal monitoring per order from Paula Compton, CNM.   Objective:    BP 130/70   Pulse 94   Ht 5\' 3"  (1.6 m)   Wt 206 lb 9.6 oz (93.7 kg)   LMP 10/09/2021 (Within Weeks) Comment: stopped ocp when had +home UPT on 12/02/21  BMI 36.60 kg/m  Estimated Date of Delivery: 08/10/22  [redacted]w[redacted]d  Fetus A Non-Stress Test Interpretation for 07/18/22  Indication:  Bilateral Club Feet  Fetal Heart Rate A Mode: External Baseline Rate (A): 140 bpm Variability: Moderate Accelerations: 15 x 15 Decelerations: None Multiple birth?: No  Uterine Activity Mode: Toco Contraction Frequency (min): None  Interpretation (Fetal Testing) Nonstress Test Interpretation: Reactive Overall Impression: Reassuring for gestational age   Assessment:   1. 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 [redacted] weeks gestation of pregnancy      Plan:   Results reviewed and discussed with patient by  Hartley Barefoot, CNM.     Rocco Serene, LPN

## 2022-07-18 NOTE — Patient Instructions (Signed)

## 2022-07-18 NOTE — Progress Notes (Signed)
   PRENATAL VISIT NOTE  Subjective:  Megan Bell is a 37 y.o. G3P2002 at [redacted]w[redacted]d being seen today for ongoing prenatal care.  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  Patient reports  leg & pelvic pain with baby dropping .  Contractions: Irritability. Vag. Bleeding: None.  Movement: Present. Denies leaking of fluid.   The following portions of the patient's history were reviewed and updated as appropriate: allergies, current medications, past family history, past medical history, past social history, past surgical history and problem list.   Objective:   Vitals:   07/18/22 0850  BP: 130/70  Pulse: 94  Weight: 206 lb 9.6 oz (93.7 kg)   Total weight gain: 1 lb 9.6 oz (0.726 kg) Fetal Status: Fetal Heart Rate (bpm): 140 (RNST) Fundal Height: 37 cm Movement: Present      General:  Alert, oriented and cooperative. Patient is in no acute distress.  Skin: Skin is warm and dry. No rash noted.   Cardiovascular: Normal heart rate noted  Respiratory: Normal respiratory effort, no problems with respiration noted  Abdomen: Soft, gravid, appropriate for gestational age.  Pain/Pressure: Present     Pelvic: Cervical exam deferred        Extremities: Normal range of motion.  Edema: Mild pitting, slight indentation  Mental Status: Normal mood and affect. Normal behavior. Normal judgment and thought content.   Assessment and Plan:  Pregnancy: G3P2002 at [redacted]w[redacted]d 1. Supervision of high risk pregnancy in third trimester - POCT  Urinalysis Dipstick  2. [redacted] weeks gestation of pregnancy - POCT Urinalysis Dipstick  3. Supervision of high risk pregnancy in second trimester   Preterm labor symptoms and general obstetric precautions including but not limited to vaginal bleeding, contractions, leaking of fluid and fetal movement were reviewed in detail with the patient. Continue weekly visits with NST for diagnosis of bilateral club feet until delivery. Cervical ripening methods reviewed, while she would like to avoid IOL she is also concerned about childcare & would like to schedule IOL at 39+ if not yet delivered.  Please refer to After Visit Summary for other counseling recommendations.   Return in 1 week (on 07/25/2022) for ROB with NST.  Future Appointments  Date Time Provider Department Center  07/25/2022  8:15 AM [facility] ROOM [facility] None  07/25/2022  8:55 AM Doreene Burke, CNM [facility] None  08/02/2022  8:15 AM [facility] ROOM [facility] None  08/02/2022  8:55 AM Doreene Burke, CNM [facility] None    Dominica Severin, CNM

## 2022-07-19 ENCOUNTER — Encounter: Payer: Self-pay | Admitting: Certified Nurse Midwife

## 2022-07-24 DIAGNOSIS — Z3A37 37 weeks gestation of pregnancy: Secondary | ICD-10-CM | POA: Insufficient documentation

## 2022-07-24 NOTE — Progress Notes (Unsigned)
    NURSE VISIT NOTE  Subjective:    Patient ID: Megan Bell, female    DOB: 15-Jun-1985, 37 y.o.   MRN: [id number]  HPI  Patient is a 37 y.o. G87P2002 female who presents for fetal monitoring per order from Hartley Barefoot, CNM.   Objective:    LMP 10/09/2021 (Within Weeks) Comment: stopped ocp when had +home UPT on 12/02/21 Estimated Date of Delivery: 08/10/22  [redacted]w[redacted]d  Fetus A Non-Stress Test Interpretation for 07/24/22  Indication:  Bilateral Club Feet            Assessment:   1. 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 [redacted] weeks gestation of pregnancy      Plan:   Results reviewed and discussed with patient by  Doreene Burke, CNM.     Rocco Serene, LPN

## 2022-07-24 NOTE — Patient Instructions (Signed)

## 2022-07-25 ENCOUNTER — Ambulatory Visit (INDEPENDENT_AMBULATORY_CARE_PROVIDER_SITE_OTHER): Payer: Medicaid Other | Admitting: Certified Nurse Midwife

## 2022-07-25 ENCOUNTER — Encounter: Payer: Self-pay | Admitting: Certified Nurse Midwife

## 2022-07-25 ENCOUNTER — Ambulatory Visit (INDEPENDENT_AMBULATORY_CARE_PROVIDER_SITE_OTHER): Payer: Medicaid Other

## 2022-07-25 VITALS — BP 102/69 | HR 94 | Ht 63.0 in | Wt 214.2 lb

## 2022-07-25 VITALS — BP 102/69 | HR 94 | Wt 214.2 lb

## 2022-07-25 DIAGNOSIS — O0993 Supervision of high risk pregnancy, unspecified, third trimester: Secondary | ICD-10-CM

## 2022-07-25 DIAGNOSIS — Q6689 Other  specified congenital deformities of feet: Secondary | ICD-10-CM

## 2022-07-25 DIAGNOSIS — Z3A37 37 weeks gestation of pregnancy: Secondary | ICD-10-CM

## 2022-07-25 DIAGNOSIS — O368330 Maternal care for abnormalities of the fetal heart rate or rhythm, third trimester, not applicable or unspecified: Secondary | ICD-10-CM

## 2022-07-25 DIAGNOSIS — Q6680 Congenital vertical talus deformity, unspecified foot: Secondary | ICD-10-CM

## 2022-07-25 LAB — POCT URINALYSIS DIPSTICK OB
Bilirubin, UA: NEGATIVE
Blood, UA: NEGATIVE
Glucose, UA: NEGATIVE
Ketones, UA: NEGATIVE
Leukocytes, UA: NEGATIVE
Nitrite, UA: NEGATIVE
POC,PROTEIN,UA: NEGATIVE
Spec Grav, UA: 1.01 (ref 1.010–1.025)
Urobilinogen, UA: 0.2 E.U./dL — AB
pH, UA: 6 (ref 5.0–8.0)

## 2022-07-25 NOTE — Progress Notes (Addendum)
ROB and NST ,   See RN note for NST documetation. NST reactive  Feeling good movement. Discussed wt gain of 8 lbs, she has mild swelling in legs feet, She denies pre eclampsia symptoms. BP normal. Will continue to monitor.   Continues to request induction 39 wks. Discussed with Dr. Valentino Saxon . She is in agreement to induction . Scheduled for 6/13 @0800 . Pt informed that it is an elective induction , reviewed possibility of rescheduling for L&D census .   Follow up 1 wk or prn .   Doreene Burke, CNM

## 2022-07-25 NOTE — Addendum Note (Signed)
Addended by: Mechele Claude on: 07/25/2022 11:25 AM   Modules accepted: Orders

## 2022-07-25 NOTE — Patient Instructions (Signed)
.  Braxton Hicks Contractions  Contractions of the uterus can occur throughout pregnancy, but they are not always a sign that you are in labor. You may have practice contractions called Braxton Hicks contractions. These false labor contractions are sometimes confused with true labor. What are Braxton Hicks contractions? Braxton Hicks contractions are tightening movements that occur in the muscles of the uterus before labor. Unlike true labor contractions, these contractions do not result in opening (dilation) and thinning of the lowest part of the uterus (cervix). Toward the end of pregnancy (32-34 weeks), Braxton Hicks contractions can happen more often and may become stronger. These contractions are sometimes difficult to tell apart from true labor because they can be very uncomfortable. How to tell the difference between true labor and false labor True labor Contractions last 30-70 seconds. Contractions become very regular. Discomfort is usually felt in the top of the uterus, and it spreads to the lower abdomen and low back. Contractions do not go away with walking. Contractions usually become stronger and more frequent. The cervix dilates and gets thinner. False labor Contractions are usually shorter, weaker, and farther apart than true labor contractions. Contractions are usually irregular. Contractions are often felt in the front of the lower abdomen and in the groin. Contractions may go away when you walk around or change positions while lying down. The cervix usually does not dilate or become thin. Sometimes, the only way to tell if you are in true labor is for your health care provider to look for changes in your cervix. Your health care provider will do a physical exam and may monitor your contractions. If you are in true labor, your health care provider will send you home with instructions about when to return to the hospital. You may continue to have Braxton Hicks contractions until  you go into true labor. Follow these instructions at home:  Take over-the-counter and prescription medicines only as told by your health care provider. If Braxton Hicks contractions are making you uncomfortable: Change your position from lying down or resting to walking, or change from walking to resting. Sit and rest in a tub of warm water. Drink enough fluid to keep your urine pale yellow. Dehydration may cause these contractions. Do slow and deep breathing several times an hour. Keep all follow-up visits. This is important. Contact a health care provider if: You have a fever. You have continuous pain in your abdomen. Your contractions become stronger, more regular, and closer together. You pass blood-tinged mucus. Get help right away if: You have fluid leaking or gushing from your vagina. You have bright red blood coming from your vagina. Your baby is not moving inside you as much as it used to. Summary You may have practice contractions called Braxton Hicks contractions. These false labor contractions are sometimes confused with true labor. Braxton Hicks contractions are usually shorter, weaker, farther apart, and less regular than true labor contractions. True labor contractions usually become stronger, more regular, and more frequent. Manage discomfort from Braxton Hicks contractions by changing position, resting in a warm bath, practicing deep breathing, and drinking plenty of water. Keep all follow-up visits. Contact your health care provider if your contractions become stronger, more regular, and closer together. This information is not intended to replace advice given to you by your health care provider. Make sure you discuss any questions you have with your health care provider. Document Revised: 12/15/2019 Document Reviewed: 12/15/2019 Elsevier Patient Education  2024 Elsevier Inc.  

## 2022-07-26 ENCOUNTER — Encounter: Payer: Self-pay | Admitting: Certified Nurse Midwife

## 2022-08-01 DIAGNOSIS — Z3A38 38 weeks gestation of pregnancy: Secondary | ICD-10-CM | POA: Insufficient documentation

## 2022-08-01 NOTE — Progress Notes (Unsigned)
    NURSE VISIT NOTE  Subjective:    Patient ID: YANEL DOMBROSKY, female    DOB: [birth year], 37 y.o.   MRN: [id number]  HPI  Patient is a 37 y.o. G3P2002 female who presents for fetal monitoring per order from Doreene Burke, CNM.   Objective:    LMP 10/09/2021 (Within Weeks) Comment: stopped ocp when had +home UPT on 12/02/21 Estimated Date of Delivery: 08/10/22  [redacted]w[redacted]d  Fetus A Non-Stress Test Interpretation for 08/01/22  Indication:  Bilateral Club Feet            Assessment:   1. 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 [redacted] weeks gestation of pregnancy      Plan:   Results reviewed and discussed with patient by  Doreene Burke, CNM.     Rocco Serene, LPN

## 2022-08-01 NOTE — Patient Instructions (Signed)

## 2022-08-02 ENCOUNTER — Ambulatory Visit (INDEPENDENT_AMBULATORY_CARE_PROVIDER_SITE_OTHER): Payer: Medicaid Other | Admitting: Certified Nurse Midwife

## 2022-08-02 ENCOUNTER — Ambulatory Visit (INDEPENDENT_AMBULATORY_CARE_PROVIDER_SITE_OTHER): Payer: Medicaid Other

## 2022-08-02 VITALS — BP 107/70 | HR 102 | Ht 63.0 in | Wt 214.8 lb

## 2022-08-02 VITALS — BP 107/70 | HR 102 | Wt 214.8 lb

## 2022-08-02 DIAGNOSIS — Q6689 Other  specified congenital deformities of feet: Secondary | ICD-10-CM

## 2022-08-02 DIAGNOSIS — Z3A38 38 weeks gestation of pregnancy: Secondary | ICD-10-CM

## 2022-08-02 DIAGNOSIS — Z3483 Encounter for supervision of other normal pregnancy, third trimester: Secondary | ICD-10-CM

## 2022-08-02 DIAGNOSIS — O35HXX0 Maternal care for other (suspected) fetal abnormality and damage, fetal lower extremities anomalies, not applicable or unspecified: Secondary | ICD-10-CM

## 2022-08-02 LAB — POCT URINALYSIS DIPSTICK OB
Bilirubin, UA: NEGATIVE
Blood, UA: NEGATIVE
Glucose, UA: NEGATIVE
Ketones, UA: NEGATIVE
Leukocytes, UA: NEGATIVE
Nitrite, UA: NEGATIVE
POC,PROTEIN,UA: NEGATIVE
Spec Grav, UA: 1.01 (ref 1.010–1.025)
Urobilinogen, UA: 0.2 E.U./dL
pH, UA: 6 (ref 5.0–8.0)

## 2022-08-02 NOTE — Progress Notes (Signed)
ROB and NST for club feet  Has induction scheduled tomorrow for child care issues.   NST reactive seen nurse note.   SVE today per pt request 1-2 cm/ 60%-2     Follow up as scheduled for induction    Doreene Burke, CNM

## 2022-08-02 NOTE — Addendum Note (Signed)
Addended by: Fonda Kinder on: 08/02/2022 09:29 AM   Modules accepted: Orders

## 2022-08-03 ENCOUNTER — Other Ambulatory Visit: Payer: Self-pay

## 2022-08-03 ENCOUNTER — Inpatient Hospital Stay: Payer: Medicaid Other | Admitting: Anesthesiology

## 2022-08-03 ENCOUNTER — Encounter: Payer: Self-pay | Admitting: Obstetrics and Gynecology

## 2022-08-03 ENCOUNTER — Encounter: Payer: Self-pay | Admitting: Certified Nurse Midwife

## 2022-08-03 ENCOUNTER — Inpatient Hospital Stay
Admission: RE | Admit: 2022-08-03 | Discharge: 2022-08-05 | DRG: 807 | Disposition: A | Discharge status: Home / Self Care | Payer: Medicaid Other | Attending: Certified Nurse Midwife | Admitting: Certified Nurse Midwife

## 2022-08-03 DIAGNOSIS — O35HXX0 Maternal care for other (suspected) fetal abnormality and damage, fetal lower extremities anomalies, not applicable or unspecified: Principal | ICD-10-CM | POA: Diagnosis present

## 2022-08-03 DIAGNOSIS — O99324 Drug use complicating childbirth: Secondary | ICD-10-CM | POA: Diagnosis not present

## 2022-08-03 DIAGNOSIS — O99320 Drug use complicating pregnancy, unspecified trimester: Secondary | ICD-10-CM | POA: Diagnosis present

## 2022-08-03 DIAGNOSIS — O26893 Other specified pregnancy related conditions, third trimester: Secondary | ICD-10-CM | POA: Diagnosis present

## 2022-08-03 DIAGNOSIS — Z3A39 39 weeks gestation of pregnancy: Secondary | ICD-10-CM

## 2022-08-03 DIAGNOSIS — O99214 Obesity complicating childbirth: Secondary | ICD-10-CM | POA: Diagnosis present

## 2022-08-03 DIAGNOSIS — F1290 Cannabis use, unspecified, uncomplicated: Secondary | ICD-10-CM | POA: Diagnosis not present

## 2022-08-03 DIAGNOSIS — Z3A37 37 weeks gestation of pregnancy: Principal | ICD-10-CM

## 2022-08-03 LAB — CBC
HCT: 35.8 % — ABNORMAL LOW (ref 36.0–46.0)
Hemoglobin: 11.8 g/dL — ABNORMAL LOW (ref 12.0–15.0)
MCH: 31.4 pg (ref 26.0–34.0)
MCHC: 33 g/dL (ref 30.0–36.0)
MCV: 95.2 fL (ref 80.0–100.0)
Platelets: 329 10*3/uL (ref 150–400)
RBC: 3.76 MIL/uL — ABNORMAL LOW (ref 3.87–5.11)
RDW: 13.8 % (ref 11.5–15.5)
WBC: 14.2 10*3/uL — ABNORMAL HIGH (ref 4.0–10.5)
nRBC: 0 % (ref 0.0–0.2)

## 2022-08-03 LAB — TYPE AND SCREEN
ABO/RH(D): O POS
Antibody Screen: NEGATIVE

## 2022-08-03 LAB — URINE DRUG SCREEN, QUALITATIVE (ARMC ONLY)
Amphetamines, Ur Screen: NOT DETECTED
Barbiturates, Ur Screen: NOT DETECTED
Benzodiazepine, Ur Scrn: NOT DETECTED
Cannabinoid 50 Ng, Ur ~~LOC~~: POSITIVE — AB
Cocaine Metabolite,Ur ~~LOC~~: NOT DETECTED
MDMA (Ecstasy)Ur Screen: NOT DETECTED
Methadone Scn, Ur: NOT DETECTED
Opiate, Ur Screen: NOT DETECTED
Phencyclidine (PCP) Ur S: NOT DETECTED
Tricyclic, Ur Screen: NOT DETECTED

## 2022-08-03 MED ORDER — MISOPROSTOL 50MCG HALF TABLET
50.0000 ug | ORAL_TABLET | Freq: Once | ORAL | Status: AC
  Administered 2022-08-03: 50 ug via ORAL
  Filled 2022-08-03: qty 1

## 2022-08-03 MED ORDER — LIDOCAINE HCL (PF) 1 % IJ SOLN: 30.0000 mL | INTRAMUSCULAR | Status: DC | PRN

## 2022-08-03 MED ORDER — ONDANSETRON HCL 4 MG/2ML IJ SOLN: 4.0000 mg | Freq: Four times a day (QID) | INTRAMUSCULAR | Status: DC | PRN

## 2022-08-03 MED ORDER — TERBUTALINE SULFATE 1 MG/ML IJ SOLN: 0.2500 mg | Freq: Once | INTRAMUSCULAR | Status: DC | PRN

## 2022-08-03 MED ORDER — FENTANYL CITRATE (PF) 100 MCG/2ML IJ SOLN: 50.0000 ug | INTRAMUSCULAR | Status: DC | PRN

## 2022-08-03 MED ORDER — MISOPROSTOL 50MCG HALF TABLET
50.0000 ug | ORAL_TABLET | Freq: Once | ORAL | Status: AC
  Administered 2022-08-03: 50 ug via VAGINAL
  Filled 2022-08-03: qty 1

## 2022-08-03 MED ORDER — LACTATED RINGERS IV SOLN: 500.0000 mL | Freq: Once | INTRAVENOUS | Status: DC

## 2022-08-03 MED ORDER — OXYTOCIN-SODIUM CHLORIDE 30-0.9 UT/500ML-% IV SOLN
2.5000 [IU]/h | INTRAVENOUS | Status: DC
  Filled 2022-08-03: qty 500

## 2022-08-03 MED ORDER — EPHEDRINE 5 MG/ML INJ: 10.0000 mg | INTRAVENOUS | Status: DC | PRN

## 2022-08-03 MED ORDER — FENTANYL-BUPIVACAINE-NACL 0.5-0.125-0.9 MG/250ML-% EP SOLN
12.0000 mL/h | EPIDURAL | Status: DC | PRN
  Administered 2022-08-03: 12 mL/h via EPIDURAL

## 2022-08-03 MED ORDER — LACTATED RINGERS IV SOLN
500.0000 mL | INTRAVENOUS | Status: DC | PRN
  Administered 2022-08-03 (×2): 500 mL via INTRAVENOUS

## 2022-08-03 MED ORDER — FENTANYL-BUPIVACAINE-NACL 0.5-0.125-0.9 MG/250ML-% EP SOLN
EPIDURAL | Status: AC
  Filled 2022-08-03: qty 250

## 2022-08-03 MED ORDER — LACTATED RINGERS IV SOLN: INTRAVENOUS | Status: DC

## 2022-08-03 MED ORDER — LIDOCAINE-EPINEPHRINE (PF) 1.5 %-1:200000 IJ SOLN
INTRAMUSCULAR | Status: DC | PRN
  Administered 2022-08-03: 3 mL via EPIDURAL

## 2022-08-03 MED ORDER — DIPHENHYDRAMINE HCL 50 MG/ML IJ SOLN: 12.5000 mg | INTRAMUSCULAR | Status: DC | PRN

## 2022-08-03 MED ORDER — PHENYLEPHRINE 80 MCG/ML (10ML) SYRINGE FOR IV PUSH (FOR BLOOD PRESSURE SUPPORT): 80.0000 ug | PREFILLED_SYRINGE | INTRAVENOUS | Status: DC | PRN

## 2022-08-03 MED ORDER — OXYTOCIN 10 UNIT/ML IJ SOLN
INTRAMUSCULAR | Status: AC
  Filled 2022-08-03: qty 2

## 2022-08-03 MED ORDER — SOD CITRATE-CITRIC ACID 500-334 MG/5ML PO SOLN: 30.0000 mL | ORAL | Status: DC | PRN

## 2022-08-03 MED ORDER — OXYTOCIN BOLUS FROM INFUSION
333.0000 mL | Freq: Once | INTRAVENOUS | Status: AC
  Administered 2022-08-04: 333 mL via INTRAVENOUS

## 2022-08-03 MED ORDER — OXYTOCIN-SODIUM CHLORIDE 30-0.9 UT/500ML-% IV SOLN
1.0000 m[IU]/min | INTRAVENOUS | Status: DC
  Administered 2022-08-03: 2 m[IU]/min via INTRAVENOUS

## 2022-08-03 MED ORDER — ACETAMINOPHEN 325 MG PO TABS
650.0000 mg | ORAL_TABLET | ORAL | Status: DC | PRN
  Administered 2022-08-04: 650 mg via ORAL

## 2022-08-03 MED ORDER — LIDOCAINE HCL (PF) 1 % IJ SOLN
INTRAMUSCULAR | Status: AC
  Filled 2022-08-03: qty 30

## 2022-08-03 MED ORDER — BUPIVACAINE HCL (PF) 0.25 % IJ SOLN
INTRAMUSCULAR | Status: DC | PRN
  Administered 2022-08-03: 8 mL via EPIDURAL

## 2022-08-03 MED ORDER — MISOPROSTOL 200 MCG PO TABS
ORAL_TABLET | ORAL | Status: AC
  Filled 2022-08-03: qty 4

## 2022-08-03 MED ORDER — AMMONIA AROMATIC IN INHA
RESPIRATORY_TRACT | Status: AC
  Filled 2022-08-03: qty 10

## 2022-08-03 NOTE — Anesthesia Preprocedure Evaluation (Addendum)
Anesthesia Evaluation  Patient identified by MRN, date of birth, ID band Patient awake    Reviewed: Allergy & Precautions, NPO status , Patient's Chart, lab work & pertinent test results  History of Anesthesia Complications Negative for: history of anesthetic complications  Airway Mallampati: I   Neck ROM: Full    Dental  (+) Missing   Pulmonary former smoker   Pulmonary exam normal breath sounds clear to auscultation       Cardiovascular Exercise Tolerance: Good negative cardio ROS Normal cardiovascular exam Rhythm:Regular Rate:Normal     Neuro/Psych negative neurological ROS     GI/Hepatic negative GI ROS,,,  Endo/Other  Obesity   Renal/GU negative Renal ROS     Musculoskeletal   Abdominal   Peds  Hematology  (+) Blood dyscrasia, anemia   Anesthesia Other Findings 37 yo G3P2002 at 52 0/7 requesting labor epidural.  Reproductive/Obstetrics                             Anesthesia Physical Anesthesia Plan  ASA: 2  Anesthesia Plan: Epidural   Post-op Pain Management:    Induction:   PONV Risk Score and Plan: 2 and Treatment may vary due to age or medical condition  Airway Management Planned: Natural Airway  Additional Equipment:   Intra-op Plan:   Post-operative Plan:   Informed Consent: I have reviewed the patients History and Physical, chart, labs and discussed the procedure including the risks, benefits and alternatives for the proposed anesthesia with the patient or authorized representative who has indicated his/her understanding and acceptance.     Dental Advisory Given  Plan Discussed with:   Anesthesia Plan Comments: (Patient reports no bleeding problems and no anticoagulant use.   Patient consented for risks of anesthesia including but not limited to:  - adverse reactions to medications - risk of bleeding, infection and or nerve damage from epidural that  could lead to paralysis - risk of headache or failed epidural - nerve damage due to positioning - that if epidural is used for C-section that there is a chance of epidural failure requiring spinal placement or conversion to GA - damage to heart, brain, lungs, other parts of body or loss of life  Patient voiced understanding.)       Anesthesia Quick Evaluation

## 2022-08-03 NOTE — Progress Notes (Signed)
LABOR NOTE   SUBJECTIVE:   Megan Bell is a 37 y.o.  G3P2002  at [redacted]w[redacted]d. Contractions uncomfortable, coping well.    Analgesia:  Plans epidural  OBJECTIVE:  BP 124/69 (BP Location: Left Arm)   Pulse 76   Temp 97.6 F (36.4 C) (Oral)   Resp 16   LMP 10/09/2021 (Within Weeks) Comment: stopped ocp when had +home UPT on 12/02/21 No intake/output data recorded.  SVE:   Dilation: 4.5 Effacement (%): 70 Station: -2 Exam by:: Keitha Butte, CNM CONTRACTIONS: regular, every 1.5-4 minutes FHR: Fetal heart tracing reviewed. Baseline: 125 Variability: moderate Accelerations: present Decelerations:none Category I  Labs: Lab Results  Component Value Date   WBC 14.2 (H) 08/03/2022   HGB 11.8 (L) 08/03/2022   HCT 35.8 (L) 08/03/2022   MCV 95.2 08/03/2022   PLT 329 08/03/2022    ASSESSMENT: 1) Induction of labor due to fetal anomaly & social indications, contracting well with cervical change after one dose misoprostol & Cook catheter     Coping: desires epidural     Membranes: intact     GBS negative        Active Problems:   Labor and delivery, indication for care   PLAN: Recheck in 1-2h after epidural placement Anticipate NSVD   Dominica Severin, CNM 08/03/2022 1:55 PM

## 2022-08-03 NOTE — Progress Notes (Signed)
LABOR NOTE   SUBJECTIVE:   Megan Bell is a 37 y.o.  G3P2002  at [redacted]w[redacted]d. Comfortable with epidural. Feeling intermittent pressure.    Analgesia: Epidural  OBJECTIVE:  BP 114/67 (BP Location: Left Arm)   Pulse 79   Temp 98.2 F (36.8 C) (Oral)   Resp 18   LMP 10/09/2021 (Within Weeks) Comment: stopped ocp when had +home UPT on 12/02/21  SpO2 99%  No intake/output data recorded.  SVE:   Dilation: 8 Effacement (%): 80 Station: -1 Exam by:: Keitha Butte, CNM CONTRACTIONS: regular, every 2-3 minutes FHR: Fetal heart tracing reviewed. Baseline: 140 Variability: moderate Accelerations: present Decelerations:none Category I  Labs: Lab Results  Component Value Date   WBC 14.2 (H) 08/03/2022   HGB 11.8 (L) 08/03/2022   HCT 35.8 (L) 08/03/2022   MCV 95.2 08/03/2022   PLT 329 08/03/2022    ASSESSMENT: 1) Induction of labor due to fetal anomaly,  progressing well on pitocin     Coping: comfortable with epidural     Membranes: ruptured, clear fluid           2) GBS negative  Active Problems:   Labor and delivery, indication for care   PLAN: continue present management, pitocin titration, recheck in 1-2h or with increasing vaginal pressure Anticipate NSVD   Dominica Severin, CNM 08/03/2022 10:52 PM

## 2022-08-03 NOTE — Progress Notes (Signed)
LABOR NOTE   SUBJECTIVE:   Megan Bell is a 37 y.o.  G3P2002  at [redacted]w[redacted]d. Comfortable with epidural. Feeling more pressure with some contractions.    Analgesia: Epidural  OBJECTIVE:  BP 107/63   Pulse 76   Temp 98.5 F (36.9 C) (Oral)   Resp 16   LMP 10/09/2021 (Within Weeks) Comment: stopped ocp when had +home UPT on 12/02/21  SpO2 100%  No intake/output data recorded.  SVE:   Dilation:  Effacement (%): 80 Station: -1 Exam by:: Keitha Butte, CNM CONTRACTIONS: regular, every 2-3 minutes FHR: Fetal heart tracing reviewed. Baseline: 135 Variability: moderate Accelerations: present Decelerations:none Category I  Labs: Lab Results  Component Value Date   WBC 14.2 (H) 08/03/2022   HGB 11.8 (L) 08/03/2022   HCT 35.8 (L) 08/03/2022   MCV 95.2 08/03/2022   PLT 329 08/03/2022    ASSESSMENT: 1) Induction of labor due to fetal anomaly,  progressing well on pitocin     Coping: comfortable with epidural     Membranes: ruptured, clear fluid            Active Problems:   Labor and delivery, indication for care   PLAN: continue present management, recheck in 1-2h or with increased vaginal pressure. Anticipate NSVD   Dominica Severin, CNM 08/03/2022 8:59 PM

## 2022-08-03 NOTE — Anesthesia Procedure Notes (Signed)
Epidural Patient location during procedure: OB Start time: 08/03/2022 2:16 PM End time: 08/03/2022 2:34 PM  Staffing Anesthesiologist: Reed Breech, MD Performed: anesthesiologist   Preanesthetic Checklist Completed: patient identified, IV checked, risks and benefits discussed, surgical consent, monitors and equipment checked, pre-op evaluation and timeout performed  Epidural Patient position: sitting Prep: Betadine Patient monitoring: heart rate, continuous pulse ox and blood pressure Approach: midline Location: L2-L3 Injection technique: LOR air  Needle:  Needle type: Tuohy  Needle gauge: 17 G Needle length: 9 cm Needle insertion depth: 4.5 cm Catheter at skin depth: 10.5 cm Test dose: negative and 1.5% lidocaine with Epi 1:200 K  Assessment Sensory level: T4  Additional Notes Difficult placement.  Os encountered at L3-4.  Placed L2-3.Reason for block:procedure for pain

## 2022-08-03 NOTE — H&P (Signed)
History and Physical   HPI  Megan Bell is a 37 y.o. G3P2002 at [redacted]w[redacted]d Estimated Date of Delivery: 08/10/22 who is being admitted for induction of labor due to social reasons (difficulty obtaining childcare) and known bilateral club feet. Endorses fetal movement, denies painful contractions. Denies HA, visual changes or RUQ pain.    OB History  OB History  Gravida Para Term Preterm AB Living  3 2 2  0 0 2  SAB IAB Ectopic Multiple Live Births  0 0 0 0 2    # Outcome Date GA Lbr Len/2nd Weight Sex Delivery Anes PTL Lv  3 Current           2 Term 07/27/20 [redacted]w[redacted]d / 00:28 3370 g M Vag-Spont EPI  LIV     Name: Reita Chard     Apgar1: 8  Apgar5: 9  1 Term 03/22/09 [redacted]w[redacted]d  2807 g F Vag-Spont   LIV     Birth Comments: deceled once or twice during labor but baby okay     Name: TIFA    PROBLEM LIST  Pregnancy complications or risks: Patient Active Problem List   Diagnosis Date Noted   Labor and delivery, indication for care 08/03/2022   Family history of ovarian cancer 05/02/2022   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 03/16/22 u/s 03/29/2022   Family history of congenital hearing loss (pt, her nephew, FOB's son) 03/29/2022   Advanced maternal age in multigravida 37 yo 01/20/2022   Obesity affecting pregnancy BMI=34.5 01/20/2022   Supervision of high risk pregnancy in second trimester 01/20/2022   Vapes nicotine containing substance 01/20/2022   Teeth missing; upper partial 01/20/2022   Marijuana use 01/20/2022    Prenatal labs and studies: ABO, Rh: --/--/O POS (06/13 9629) Antibody: NEG (06/13 0903) Rubella: 24.60 (12/01 1029) RPR: Non Reactive (04/08 1024)  HBsAg: Negative (12/01 1029)  HIV: Non Reactive (04/08 1024)  BMW:UXLKGMWN/-- (05/20 1042)   Past Medical History:  Diagnosis Date   Abnormal Pap smear of cervix 2011   didn't f/u   Anemia    "years ago"   Endometriosis 2011   of ovarian cyst during preg on u/s 2011   Family history of ovarian cancer 05/02/2022   History  of suicide attempt age 8 (OD on Advil) 01/20/2022     Past Surgical History:  Procedure Laterality Date   LAPAROSCOPIC CHOLECYSTECTOMY  2011   Dr. Evette Cristal   Removal of wisdom tooth x3     early 2023     Medications    Current Discharge Medication List     CONTINUE these medications which have NOT CHANGED   Details  aspirin EC 81 MG tablet Take 1 tablet by mouth daily.    Prenatal Vit-Fe Fumarate-FA (MULTIVITAMIN-PRENATAL) 27-0.8 MG TABS tablet Take 1 tablet by mouth daily at 12 noon.         Allergies  Patient has no known allergies.  Review of Systems  Pertinent items are noted in HPI.  Physical Exam  BP 119/73 (BP Location: Left Arm)   Pulse 90   Temp 97.8 F (36.6 C) (Oral)   Resp 16   LMP 10/09/2021 (Within Weeks) Comment: stopped ocp when had +home UPT on 12/02/21  General: A&Ox4, NAD Lungs:  CTA B Cardio: RRR without M/R/G Abd: Soft, gravid, NT Presentation: cephalic EFW 7 1/2lb by Nolon Lennert  CERVIX:   1/60/-2, Adriana Simas placed  See Prenatal records for more detailed PE.     FHR:  Baseline: 130 Variability: moderate Accelerations:  present Decelerations: absent  Toco: occasional, irregular Category I  Test Results  Results for orders placed or performed during the hospital encounter of 08/03/22 (from the past 24 hour(s))  CBC     Status: Abnormal   Collection Time: 08/03/22  9:03 AM  Result Value Ref Range   WBC 14.2 (H) 4.0 - 10.5 K/uL   RBC 3.76 (L) 3.87 - 5.11 MIL/uL   Hemoglobin 11.8 (L) 12.0 - 15.0 g/dL   HCT 16.1 (L) 09.6 - 04.5 %   MCV 95.2 80.0 - 100.0 fL   MCH 31.4 26.0 - 34.0 pg   MCHC 33.0 30.0 - 36.0 g/dL   RDW 40.9 81.1 - 91.4 %   Platelets 329 150 - 400 K/uL   nRBC 0.0 0.0 - 0.2 %  Type and screen     Status: None   Collection Time: 08/03/22  9:03 AM  Result Value Ref Range   ABO/RH(D) O POS    Antibody Screen NEG    Sample Expiration      08/06/2022,2359 Performed at [facility], [address]  [place], [place] [address]   Urine Drug Screen, Qualitative ([facility] only)     Status: Abnormal   Collection Time: 08/03/22 10:26 AM  Result Value Ref Range   Tricyclic, Ur Screen NONE DETECTED NONE DETECTED   Amphetamines, Ur Screen NONE DETECTED NONE DETECTED   MDMA (Ecstasy)Ur Screen NONE DETECTED NONE DETECTED   Cocaine Metabolite,Ur [place] NONE DETECTED NONE DETECTED   Opiate, Ur Screen NONE DETECTED NONE DETECTED   Phencyclidine (PCP) Ur S NONE DETECTED NONE DETECTED   Cannabinoid 50 Ng, Ur [place] POSITIVE (A) NONE DETECTED   Barbiturates, Ur Screen NONE DETECTED NONE DETECTED   Benzodiazepine, Ur Scrn NONE DETECTED NONE DETECTED   Methadone Scn, Ur NONE DETECTED NONE DETECTED   Group B Strep negative  Assessment   G3P2002 at [redacted]w[redacted]d Estimated Date of Delivery: 08/10/22  Reassuring maternal/fetal status. Cook catheter placed without problem, instilled with 60ml uterine/16ml vaginal.  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 Admit to L&D :  misoprostol & Cook catheter. AROM/pitocin prn. 2. EFM: CEFM-- Category 1 3. Pharmacologic pain relief if desired.   4. Admission labs  5. Anticipate NSVD 6. MD notified of admission  Dominica Severin, CNM 08/03/2022 11:00 AM

## 2022-08-04 ENCOUNTER — Encounter: Payer: Self-pay | Admitting: Obstetrics and Gynecology

## 2022-08-04 DIAGNOSIS — O35HXX0 Maternal care for other (suspected) fetal abnormality and damage, fetal lower extremities anomalies, not applicable or unspecified: Principal | ICD-10-CM

## 2022-08-04 DIAGNOSIS — O99324 Drug use complicating childbirth: Secondary | ICD-10-CM

## 2022-08-04 DIAGNOSIS — F1290 Cannabis use, unspecified, uncomplicated: Secondary | ICD-10-CM

## 2022-08-04 DIAGNOSIS — Z3A39 39 weeks gestation of pregnancy: Secondary | ICD-10-CM

## 2022-08-04 LAB — RPR: RPR Ser Ql: NONREACTIVE

## 2022-08-04 MED ORDER — ONDANSETRON HCL 4 MG/2ML IJ SOLN
4.0000 mg | INTRAMUSCULAR | Status: DC | PRN
  Administered 2022-08-04: 4 mg via INTRAVENOUS
  Filled 2022-08-04: qty 2

## 2022-08-04 MED ORDER — WITCH HAZEL-GLYCERIN EX PADS
1.0000 | MEDICATED_PAD | CUTANEOUS | Status: DC | PRN
  Administered 2022-08-04: 1 via TOPICAL
  Filled 2022-08-04 (×2): qty 100

## 2022-08-04 MED ORDER — ZOLPIDEM TARTRATE 5 MG PO TABS: 5.0000 mg | ORAL_TABLET | Freq: Every evening | ORAL | Status: DC | PRN

## 2022-08-04 MED ORDER — PRENATAL MULTIVITAMIN CH
1.0000 | ORAL_TABLET | Freq: Every day | ORAL | Status: DC
  Administered 2022-08-04: 1 via ORAL
  Filled 2022-08-04: qty 1

## 2022-08-04 MED ORDER — BENZOCAINE-MENTHOL 20-0.5 % EX AERO
1.0000 | INHALATION_SPRAY | CUTANEOUS | Status: DC | PRN
  Administered 2022-08-04: 1 via TOPICAL
  Filled 2022-08-04 (×2): qty 56

## 2022-08-04 MED ORDER — ACETAMINOPHEN 325 MG PO TABS
650.0000 mg | ORAL_TABLET | ORAL | Status: DC | PRN
  Filled 2022-08-04 (×2): qty 2

## 2022-08-04 MED ORDER — DIPHENHYDRAMINE HCL 25 MG PO CAPS: 25.0000 mg | ORAL_CAPSULE | Freq: Four times a day (QID) | ORAL | Status: DC | PRN

## 2022-08-04 MED ORDER — ONDANSETRON HCL 4 MG PO TABS: 4.0000 mg | ORAL_TABLET | ORAL | Status: DC | PRN

## 2022-08-04 MED ORDER — DIBUCAINE (PERIANAL) 1 % EX OINT
1.0000 | TOPICAL_OINTMENT | CUTANEOUS | Status: DC | PRN
  Administered 2022-08-04: 1 via RECTAL
  Filled 2022-08-04 (×2): qty 28

## 2022-08-04 MED ORDER — COCONUT OIL OIL: 1.0000 | TOPICAL_OIL | Status: DC | PRN

## 2022-08-04 MED ORDER — IBUPROFEN 600 MG PO TABS
600.0000 mg | ORAL_TABLET | Freq: Four times a day (QID) | ORAL | Status: DC
  Administered 2022-08-04 – 2022-08-05 (×6): 600 mg via ORAL
  Filled 2022-08-04 (×6): qty 1

## 2022-08-04 MED ORDER — TETANUS-DIPHTH-ACELL PERTUSSIS 5-2.5-18.5 LF-MCG/0.5 IM SUSY: 0.5000 mL | PREFILLED_SYRINGE | Freq: Once | INTRAMUSCULAR | Status: DC

## 2022-08-04 MED ORDER — FLEET ENEMA 7-19 GM/118ML RE ENEM: 1.0000 | ENEMA | Freq: Every day | RECTAL | Status: DC | PRN

## 2022-08-04 MED ORDER — SIMETHICONE 80 MG PO CHEW: 80.0000 mg | CHEWABLE_TABLET | ORAL | Status: DC | PRN

## 2022-08-04 MED ORDER — BISACODYL 10 MG RE SUPP: 10.0000 mg | Freq: Every day | RECTAL | Status: DC | PRN

## 2022-08-04 MED ORDER — SENNOSIDES-DOCUSATE SODIUM 8.6-50 MG PO TABS
2.0000 | ORAL_TABLET | Freq: Every day | ORAL | Status: DC
  Administered 2022-08-05: 2 via ORAL
  Filled 2022-08-04: qty 2

## 2022-08-04 NOTE — Lactation Note (Signed)
This note was copied from a baby's chart. Lactation Consultation Note  Patient Name: Girl Hesper Wimbush [id number] Date: 08/04/2022 Age:37 hours Reason for consult: Initial assessment;Term;Other (Comment) (Baby with frenulectomy today)   Maternal Data This is mom's 3rd baby, SVD. Baby with bilateral club feet. Mom with history of marijuana use, vapes nicotine containing substance, AMA. Mom reports she tried to breastfeed her 2 previous children and experienced with both babies latch issues. She pumped and at points was attempting to follow a triple feeding plan. She went to exclusive pumping and was not able to sustain the pumping plan beyond 3 months.  Today at initial consult offered mom breastfeeding assistance . Mom declines LC assistance with breastfeeding. Mom was open to review of breastfeeding basics information. Per mom she has been able to latch baby post frenulectomy that was done today. Mom is keeping feeding/output record. Baby has had 2 wets and stools Has patient been taught Hand Expression?: Yes Does the patient have breastfeeding experience prior to this delivery?: Yes How long did the patient breastfeed?: Limited experience with direct breastfeeding, pumped for 3 months with 2nd child . Per mom baby with latch issues.  Feeding Mother's Current Feeding Choice: Breast Milk  Interventions Interventions: Breast feeding basics reviewed;Education  Discharge Pump: Personal  Consult Status Consult Status: Follow-up Date: 08/05/22 Follow-up type: In-patient  Update provided to care nurse  Fuller Song 08/04/2022, 3:24 PM

## 2022-08-04 NOTE — Discharge Summary (Signed)
Postpartum Discharge Summary  Date of Service updated***     Patient Name: Megan Bell DOB: Apr 26, 1985 MRN: [id number]  Date of admission: 08/03/2022 Delivery date:08/04/2022  Delivering provider: Dominica Severin  Date of discharge: 08/04/2022  Admitting diagnosis: Labor and delivery, indication for care [O75.9] Intrauterine pregnancy: [redacted]w[redacted]d     Secondary diagnosis:  Active Problems:   Postpartum care following vaginal delivery   Marijuana use during pregnancy   Labor and delivery, indication for care  Additional problems: ***    Discharge diagnosis: Term Pregnancy Delivered                                              Post partum procedures:{Postpartum procedures:23558} Augmentation: AROM, Pitocin, Cytotec, and IP Foley Complications: None  Hospital course: Induction of Labor With Vaginal Delivery   37 y.o. yo G3P3003 at [redacted]w[redacted]d was admitted to the hospital 08/03/2022 for induction of labor.  Indication for induction:  social, fetal bilateral clubfeet .  Patient had an labor course complicated by*** Membrane Rupture Time/Date: 5:37 PM ,08/03/2022   Delivery Method:Vaginal, Spontaneous  Episiotomy: None  Lacerations:  1st degree  Details of delivery can be found in separate delivery note.  Patient had a postpartum course complicated by***. Patient is discharged home 08/04/22.  Newborn Data: Birth date:08/04/2022  Birth time:1:13 AM  Gender:Female  Living status:Living  Apgars:8 ,9  Weight:3700 g   Magnesium Sulfate received: No BMZ received: No Rhophylac:N/A MMR:N/A T-DaP:Given prenatally Flu: N/A Transfusion:{Transfusion received:30440034}  Physical exam  Vitals:   08/04/22 0224 08/04/22 0239 08/04/22 0323 08/04/22 0428  BP: 126/81 118/68 120/65 137/66  Pulse: 95 84 74 71  Resp:    16  Temp:   98.8 F (37.1 C) 99 F (37.2 C)  TempSrc:   Oral Oral  SpO2:   97% 98%   General: {Exam; general:21111117} Lochia: {Desc;  appropriate/inappropriate:30686::"appropriate"} Uterine Fundus: {Desc; firm/soft:30687} Incision: {Exam; incision:21111123} DVT Evaluation: {Exam; dvt:2111122} Labs: Lab Results  Component Value Date   WBC 14.2 (H) 08/03/2022   HGB 11.8 (L) 08/03/2022   HCT 35.8 (L) 08/03/2022   MCV 95.2 08/03/2022   PLT 329 08/03/2022      Latest Ref Rng & Units 01/20/2022   10:29 AM  CMP  Glucose 70 - 99 mg/dL 77   BUN 6 - 20 mg/dL 5   Creatinine 1.30 - 8.65 mg/dL 7.84   Sodium 696 - 295 mmol/L 137   Potassium 3.5 - 5.2 mmol/L 4.1   Chloride 96 - 106 mmol/L 101   CO2 20 - 29 mmol/L 23   Calcium 8.7 - 10.2 mg/dL 9.2   Total Protein 6.0 - 8.5 g/dL 7.4   Total Bilirubin 0.0 - 1.2 mg/dL 0.3   Alkaline Phos 44 - 121 IU/L 74   AST 0 - 40 IU/L 23   ALT 0 - 32 IU/L 44    Edinburgh Score:    09/07/2020    2:37 PM  Edinburgh Postnatal Depression Scale Screening Tool  I have been able to laugh and see the funny side of things. 0  I have looked forward with enjoyment to things. 0  I have blamed myself unnecessarily when things went wrong. 2  I have been anxious or worried for no good reason. 0  I have felt scared or panicky for no good reason. 0  Things have been  getting on top of me. 2  I have been so unhappy that I have had difficulty sleeping. 0  I have felt sad or miserable. 0  I have been so unhappy that I have been crying. 1  The thought of harming myself has occurred to me. 0  Edinburgh Postnatal Depression Scale Total 5      After visit meds:  Allergies as of 08/04/2022   No Known Allergies   Med Rec must be completed prior to using this [facility]***        Discharge home in stable condition Infant Feeding: {Baby feeding:23562} Infant Disposition:{CHL IP OB HOME WITH BJYNWG:95621} Discharge instruction: per After Visit Summary and Postpartum booklet. Activity: Advance as tolerated. Pelvic rest for 6 weeks.  Diet: {OB diet:21111121} Anticipated Birth Control: {Birth  Control:23956} Postpartum Appointment:{Outpatient follow up:23559} Additional Postpartum F/U: {PP Procedure:23957} Future Appointments:No future appointments. Follow up Visit:      08/04/2022 Dominica Severin, CNM

## 2022-08-05 LAB — CBC
HCT: 29.2 % — ABNORMAL LOW (ref 36.0–46.0)
Hemoglobin: 9.6 g/dL — ABNORMAL LOW (ref 12.0–15.0)
MCH: 31.8 pg (ref 26.0–34.0)
MCHC: 32.9 g/dL (ref 30.0–36.0)
MCV: 96.7 fL (ref 80.0–100.0)
Platelets: 296 10*3/uL (ref 150–400)
RBC: 3.02 MIL/uL — ABNORMAL LOW (ref 3.87–5.11)
RDW: 13.9 % (ref 11.5–15.5)
WBC: 13.7 10*3/uL — ABNORMAL HIGH (ref 4.0–10.5)
nRBC: 0 % (ref 0.0–0.2)

## 2022-08-05 NOTE — Clinical Social Work Maternal (Signed)
CLINICAL SOCIAL WORK MATERNAL/CHILD NOTE  Patient Details  Name: Megan Bell MRN: [id number] Date of Birth: 07/20/1985  Date:  08/05/2022  Clinical Social Worker Initiating Note:  Susa Simmonds, [place]  Date/Time: Initiated:  08/05/22/1130     Child's Name:  Megan Bell   Biological Parents:  Mother, Father   Need for Interpreter:  None   Reason for Referral:  Current Substance Use/Substance Use During Pregnancy     Address:  [address] [place] [address]    Phone number:  [phone] (home)     Additional phone number:   Household Members/Support Persons (HM/SP):   Household Member/Support Person 1, Household Member/Support Person 2, Household Member/Support Person 3   HM/SP Name Relationship DOB or Age  HM/SP -1 Megan Bell FOB 06/03/1982  HM/SP -2 Megan Bell Daughter 03/22/2009  HM/SP -3 Megan Bell 09/09/2012  HM/SP -4        HM/SP -5        HM/SP -6        HM/SP -7        HM/SP -8          Natural Supports (not living in the home):  Friends, Immediate Family   Professional Supports: None   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)   Type of Work:     Education:  Some Materials engineer arranged:    Surveyor, quantity Resources:  [employer]   Other Resources:  Sales executive  , [employer]   Cultural/Religious Considerations Which May Impact Care:  NA  Strengths:  Ability to meet basic needs     Psychotropic Medications:         Pediatrician:   Photographer List:   [employer] [place]    [place]    [facility]      Pediatrician Fax Number: [phone]  Risk Factors/Current Problems:  None   Cognitive State:  Alert     Mood/Affect:  Calm     CSW Assessment: CSW received a consult for?drug exposed newborn. Baby's drug screen positive for Marijuana. Cord screen pending.    CSW met with MOB at bedside. Explained CSW's role and reason  for referral.   MOB reported she is feeling?good?post-delivery. MOB was alert,?appropriate, and attentive to Baby?during assessment.    ? Confirmed contact information for MOB. MOB and Baby will be living with?FOB Megan Bell, 06/03/1982) her daughter Leodis Sias 03/22/2009) and FOB son Boris Lown, 09/09/2012)?at discharge.    [facility] Drug Screen/CPS Report Policy.?MOB reported she used Marijuana during her pregnancy for nausea and trouble eating. She reported her OB was aware of her Marijuana use. She denied other substance use.?CPS Report made to [place] CPS Worker Dunn Loring following assessment with MOB, as required by policy. Informed Lurena Joiner?of plan for MOB and Baby to discharge today?and asked to be informed of any barriers to discharge prior to then.    MOB reported she receives [facility] and food stamps.   MOB plans to use [facility]?for [facility].    MOB reported she has a crib,?bassinet, car seat (new), clothing, diapers, and all other items needed for Baby.    MOB reported she has reliable transportation for herself and Baby.    MOB denied resources needs at this time.    MOB reported using marijuana during her pregnancy because she had trouble eating. MOB stated she knew it  wasn't ideal, but it was either smoke or not feed the baby.?MOB reported overall?she has a good support system and is coping well emotionally at this time. MOB denied SI, HI, or DV. MOB denied the need for mental health support resources at this time, reported she is aware of resources if needed.   CSW provided education and information sheets on PPD and SIDS. MOB verbalized understanding. CSW encouraged MOB to reach out to her Provider with any questions or needs for support or resources, even after discharge.    MOB denied any needs or questions at this time. CSW encouraged MOB to reach out if any arise prior to discharge.    Please re-consult CSW if any additional needs or  concerns arise  CSW Plan/Description:  No Further Intervention Required/No Barriers to Discharge    Susa Simmonds, LCSWA 08/05/2022, 12:18 PM

## 2022-08-05 NOTE — Progress Notes (Signed)
Patient discharged home with family.  Discharge instructions, when to follow up, and prescriptions reviewed with patient.  Patient verbalized understanding. Patient will be escorted out by auxiliary.   

## 2022-08-05 NOTE — Discharge Instructions (Signed)

## 2022-08-05 NOTE — Lactation Note (Addendum)
This note was copied from a baby's chart. Lactation Consultation Note  Patient Name: Megan Bell [id number] Date: 08/05/2022 Age:37 years Reason for consult: Follow-up assessment;Maternal discharge;Term   Maternal Data Has patient been taught Hand Expression?: Yes Does the patient have breastfeeding experience prior to this delivery?: Yes How long did the patient breastfeed?: 3 mths  Feeding Mother's Current Feeding Choice: Breast Milk and Formula Nipple Type: Slow - flow Mom states that baby has latched well a few times since delivery but is still not opening mouth well, she fed baby formula at last feeding, she plans on pumping her breasts when she gets home and feeding baby EBM and continue to work on latching baby to breast, she feels comfortable doing this and most wants baby to be fed and is not stressing about only breastfeeding at this time, she knows it may take time for baby to transition,.      LATCH Score Latch:  (I did not observe a feeding)                  Lactation Tools Discussed/Used    Interventions  Offered assistance with latching baby as needed, LC name updated on  white board  Discharge Pump: Personal WIC Program: Yes  Consult Status Consult Status: PRN Date: 08/05/22 Follow-up type: In-patient    Dyann Kief 08/05/2022, 11:42 AM

## 2022-08-07 NOTE — Anesthesia Postprocedure Evaluation (Signed)
Anesthesia Post Note  Patient: Megan Bell  Procedure(s) Performed: AN AD HOC LABOR EPIDURAL  Patient location during evaluation: Mother Baby Anesthesia Type: Epidural Level of consciousness: awake and alert Pain management: pain level controlled Vital Signs Assessment: post-procedure vital signs reviewed and stable Respiratory status: spontaneous breathing, nonlabored ventilation and respiratory function stable Cardiovascular status: stable Postop Assessment: no headache, no backache, patient able to bend at knees and able to ambulate Anesthetic complications: no   No notable events documented.   Last Vitals: There were no vitals filed for this visit.  Last Pain: There were no vitals filed for this visit.               Reed Breech

## 2024-01-09 ENCOUNTER — Encounter: Payer: Self-pay | Admitting: Emergency Medicine

## 2024-01-09 ENCOUNTER — Ambulatory Visit (INDEPENDENT_AMBULATORY_CARE_PROVIDER_SITE_OTHER)

## 2024-01-09 ENCOUNTER — Ambulatory Visit
Admission: EM | Admit: 2024-01-09 | Discharge: 2024-01-09 | Disposition: A | Discharge status: Home / Self Care | Attending: Emergency Medicine | Admitting: Emergency Medicine

## 2024-01-09 DIAGNOSIS — M79671 Pain in right foot: Secondary | ICD-10-CM

## 2024-01-09 DIAGNOSIS — L84 Corns and callosities: Secondary | ICD-10-CM | POA: Diagnosis not present

## 2024-01-09 DIAGNOSIS — Z758 Other problems related to medical facilities and other health care: Secondary | ICD-10-CM | POA: Diagnosis not present

## 2024-01-09 NOTE — Discharge Instructions (Addendum)
 Your xray is negative for fracture. Rest,ice,elevate right foot leg when able, wear ace wrap for comfort. Wear compression stockings to help with any swelling. Avoid stairs as it may aggravate foot/ankle pain. May take otc tylenol  as label directed for pain. Follow up with [facility] or orthopedist of your choice-call for appt [facility]: [address] [place], [place] [address] Phone: [phone] Urgent care hours 8a-7:30p Mon-Sat Please get established with PCP

## 2024-01-09 NOTE — ED Triage Notes (Signed)
 Pt presents with pain and swelling on the top of her right foot and ankle x 2 weeks. Pt denies any injury. She has been taking ibuprofen  with no relief.

## 2024-01-09 NOTE — ED Provider Notes (Signed)
 [facility]    CSN: [id number] Arrival date & time: 01/09/24  1041      History   Chief Complaint Chief Complaint  Patient presents with   Foot Pain    HPI Megan Bell is a 38 y.o. female.   38 year old female, Megan Bell, presents to urgent care for evaluation of pain and swelling to dorsal aspect of right foot and ankle for 2 weeks.  Patient denies any known injuries, trip over my animals.  States she has been walking up stairs more recently, has taken ibuprofen  without relief.  The history is provided by the patient. No language interpreter was used.    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 Dellie   Removal of wisdom tooth x3     early 2023    OB History     Gravida  3   Para  3   Term  3   Preterm  0   AB  0   Living  3      SAB  0   IAB   0   Ectopic  0   Multiple  0   Live Births  3            Home Medications    Prior to Admission medications   Medication Sig Start Date End Date Taking? Authorizing Provider  Prenatal Vit-Fe Fumarate-FA (MULTIVITAMIN-PRENATAL) 27-0.8 MG TABS tablet Take 1 tablet by mouth daily at 12 noon.    [provider]    Family History Family History  Problem Relation Age of Onset   Alcohol abuse Mother    Kidney Stones Father    Ovarian cancer Maternal Grandmother    Diabetes Maternal Grandfather    Heart disease Maternal Grandfather        triple bypass   Lymphoma Maternal Grandfather    Breast cancer Paternal Grandmother 18   Fibrocystic breast disease Paternal Grandmother    Obesity Paternal Grandfather    Healthy Daughter    Healthy Son    Healthy Half-Brother     Social History Social History   Tobacco Use   Smoking status: Former    Current packs/day: 0.00    Average packs/day: 1 pack/day for 10.0 years (10.0 ttl pk-yrs)    Types: Cigarettes  Start date: 2012    Quit date: 2022    Years since quitting: 3.8    Passive exposure: Past   Smokeless tobacco: Never  Vaping Use   Vaping status: Former   Substances: Nicotine, Flavoring  Substance Use Topics   Alcohol use: Not Currently    Comment: Last use early 11/2021.   Drug use: Not Currently    Types: Marijuana    Comment: Last use within the past month     Allergies   Patient has no known allergies.   Review of Systems Review of Systems  Constitutional:  Negative for fever.  Musculoskeletal:  Positive for arthralgias and gait problem.  Skin:  Negative for color change and rash.  All other systems reviewed and are negative.    Physical Exam Triage Vital Signs ED Triage Vitals  Encounter Vitals Group     BP 01/09/24 1230 135/86     Girls Systolic BP Percentile --      Girls Diastolic BP Percentile --      Boys Systolic BP Percentile --      Boys Diastolic BP Percentile --       Pulse Rate 01/09/24 1230 64     Resp 01/09/24 1230 16     Temp 01/09/24 1230 98 F (36.7 C)     Temp Source 01/09/24 1230 Oral     SpO2 01/09/24 1230 100 %     Weight 01/09/24 1229 212 lb (96.2 kg)     Height --      Head Circumference --      Peak Flow --      Pain Score 01/09/24 1229 4     Pain Loc --      Pain Education --      Exclude from Growth Chart --    No data found.  Updated Vital Signs BP 135/86 (BP Location: Left Arm)   Pulse 64   Temp 98 F (36.7 C) (Oral)   Resp 16   Wt 212 lb (96.2 kg)   LMP 01/02/2024 (Approximate)   SpO2 100%   BMI 37.55 kg/m   Visual Acuity Right Eye Distance:   Left Eye Distance:   Bilateral Distance:    Right Eye Near:   Left Eye Near:    Bilateral Near:     Physical Exam Vitals and nursing note reviewed.  Cardiovascular:     Pulses:          Dorsalis pedis pulses are 2+ on the right side and 2+ on the left side.  Musculoskeletal:       Feet:  Feet:     Right foot:     Skin integrity: Callus present.     Left foot:     Skin integrity: Callus present.  Neurological:     General: No focal deficit present.     Mental Status: She is alert and oriented to person, place, and time.     GCS: GCS eye subscore is 4. GCS verbal subscore is 5. GCS motor subscore is 6.     Sensory: Sensation is intact.  Psychiatric:        Attention and Perception: Attention normal.        Mood and Affect: Mood normal.        Behavior: Behavior normal. Behavior is cooperative.      UC Treatments / Results  Labs (all labs ordered are listed, but only abnormal results are displayed) Labs Reviewed - No data to display  EKG  Radiology DG Foot Complete Right Result Date: 01/09/2024 CLINICAL DATA:  Right foot pain and swelling 2 weeks. EXAM: RIGHT FOOT COMPLETE - 3+ VIEW COMPARISON:  None Available. FINDINGS: There is no evidence of fracture or dislocation. There is no evidence of arthropathy or other focal bone abnormality. Soft tissues  are unremarkable. IMPRESSION: Negative. Electronically Signed   By: Toribio Agreste M.D.   On: 01/09/2024 14:12    Procedures Procedures (including critical care time)  Medications Ordered in UC Medications - No data to display  Initial Impression / Assessment and Plan / UC Course  I have reviewed the triage vital signs and the nursing notes.  Pertinent labs & imaging results that were available during my care of the patient were reviewed by me and considered in my medical decision making (see chart for details).    Discussed exam findings and plan of care with patient, xray is negative, ace wrap right foot, strict go to ER precautions given.   Referred to Ortho/PCP-call for appt. Patient verbalized understanding to this provider.  Ddx: Right foot pain, callus, does not have PCP, sprain,strain, fracture Final Clinical Impressions(s) / UC Diagnoses   Final diagnoses:  Right foot pain  Does not have primary care provider  Callus     Discharge Instructions      Your xray is negative for fracture. Rest,ice,elevate right foot leg when able, wear ace wrap for comfort. Wear compression stockings to help with any swelling. Avoid stairs as it may aggravate foot/ankle pain. May take otc tylenol  as label directed for pain. Follow up with [facility] or orthopedist of your choice-call for appt [facility]: [address] [place], [place] [address] Phone: [phone] Urgent care hours 8a-7:30p Mon-Sat Please get established with PCP     ED Prescriptions   None    PDMP not reviewed this encounter.   Aminta Loose, NP 01/09/24 1437

## 2024-01-14 ENCOUNTER — Ambulatory Visit: Admitting: Family Medicine

## 2024-01-28 ENCOUNTER — Encounter: Payer: Self-pay | Admitting: Family Medicine

## 2024-01-28 ENCOUNTER — Ambulatory Visit: Admitting: Family Medicine

## 2024-01-28 VITALS — BP 118/81 | HR 80 | Temp 98.3°F | Ht 63.0 in | Wt 208.8 lb

## 2024-01-28 DIAGNOSIS — Z23 Encounter for immunization: Secondary | ICD-10-CM

## 2024-01-28 DIAGNOSIS — N951 Menopausal and female climacteric states: Secondary | ICD-10-CM | POA: Diagnosis not present

## 2024-01-28 DIAGNOSIS — T733XXA Exhaustion due to excessive exertion, initial encounter: Secondary | ICD-10-CM

## 2024-01-28 DIAGNOSIS — R5383 Other fatigue: Secondary | ICD-10-CM | POA: Insufficient documentation

## 2024-01-28 NOTE — Assessment & Plan Note (Signed)
 Reports that she is tired all of the time.  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  Will check TSH, CBC, CMP vitamin B12 and vitamin D 

## 2024-01-28 NOTE — Progress Notes (Signed)
 New Patient Office Visit  Subjective    Patient ID: Megan Bell, female    DOB: November 21, 1985  Age: 38 y.o. MRN: [id number]  CC:  Chief Complaint  Patient presents with   Establish Care    HPI Megan Bell presents to establish care Discussed the use of AI scribe software for clinical note transcription with the patient, who gave verbal consent to proceed.  History of Present Illness   Megan Bell is a 38 year old female who presents with persistent fatigue and morning nausea.  She experiences persistent fatigue and morning nausea. Upon waking, she feels fine, but within five minutes, she experiences hot flashes and nausea, sometimes leading to vomiting. These symptoms resolve quickly, allowing her to feel normal again. Nausea also occurs throughout the day, which she attributes to not eating regularly due to her busy schedule. She typically snacks throughout the day and has one large meal at dinner.  She has a history of irregular menstrual cycles, which are now more regular but still unpredictable in flow and timing. Her periods last about four days, but the flow varies from light to heavy. She has previously used Mirena and Nexplanon , which resulted in the absence of menstrual cycles. Her current contraception method involves her husband's vasectomy and condom use due to a failed checkup.  She has a history of nicotine addiction, having smoked for at least ten years, but quit smoking and vaping about a year ago. She consumes alcohol occasionally, typically one mixed drink with dinner, and uses marijuana to manage nausea and appetite issues.  Her family history includes ovarian cancer (maternal grandmother), lymphoma (maternal grandfather), and breast cancer (paternal grandmother). Her maternal grandfather also had a triple bypass surgery. She has a half-brother who struggles with alcohol use.  No routine medication use, only taking over-the-counter medications like Advil  or  Tylenol  as needed. No skin concerns or family history of skin cancer. Occasionally snores but does not experience waking up gasping for air.       Outpatient Encounter Medications as of 01/28/2024  Medication Sig   Prenatal Vit-Fe Fumarate-FA (MULTIVITAMIN-PRENATAL) 27-0.8 MG TABS tablet Take 1 tablet by mouth daily at 12 noon.   No facility-administered encounter medications on file as of 01/28/2024.    Past Medical History:  Diagnosis Date   Abnormal Pap smear of cervix 2011   didn't f/u   Anemia    years ago   Endometriosis 2011   of ovarian cyst during preg on u/s 2011   Family history of ovarian cancer 05/02/2022   History of suicide attempt age 65 (OD on Advil ) 01/20/2022    Past Surgical History:  Procedure Laterality Date   LAPAROSCOPIC CHOLECYSTECTOMY  2011   Dr. Dellie   Removal of wisdom tooth x3     early 2023    Family History  Problem Relation Age of Onset   Alcohol abuse Mother    Kidney Stones Father    Ovarian cancer Maternal Grandmother    Diabetes Maternal Grandfather    Heart disease Maternal Grandfather        triple bypass   Lymphoma Maternal Grandfather    Breast cancer Paternal Grandmother 72   Fibrocystic breast disease Paternal Grandmother    Obesity Paternal Grandfather    Healthy Daughter    Healthy Son    Healthy Half-Brother     Social History   Socioeconomic History   Marital status: Significant Other    Spouse name: Megan Bell  Number of children: 2   Years of education: 46 - pharmacy tech certificate (recently expired - no funds to renew per client)   Highest education level: Associate degree: occupational, scientist, product/process development, or vocational program  Occupational History   Occupation: homemaker  Tobacco Use   Smoking status: Former    Current packs/day: 0.00    Average packs/day: 1 pack/day for 10.0 years (10.0 ttl pk-yrs)    Types: Cigarettes    Start date: 2012    Quit date: 2022    Years since quitting: 3.9    Passive  exposure: Past   Smokeless tobacco: Never  Vaping Use   Vaping status: Former   Substances: Nicotine, Flavoring  Substance and Sexual Activity   Alcohol use: Not Currently    Comment: Last use early 11/2021.   Drug use: Not Currently    Types: Marijuana    Comment: Last use within the past month   Sexual activity: Yes    Comment: Stopped ocp 11/2021 when found out pregnant.  Other Topics Concern   Not on file  Social History Narrative   Not on file   Social Drivers of Health   Financial Resource Strain: High Risk (01/27/2024)   Overall Financial Resource Strain (CARDIA)    Difficulty of Paying Living Expenses: Hard  Food Insecurity: Food Insecurity Present (01/27/2024)   Hunger Vital Sign    Worried About Running Out of Food in the Last Year: Often true    Ran Out of Food in the Last Year: Sometimes true  Transportation Needs: No Transportation Needs (01/27/2024)   PRAPARE - Administrator, Civil Service (Medical): No    Lack of Transportation (Non-Medical): No  Physical Activity: Insufficiently Active (01/27/2024)   Exercise Vital Sign    Days of Exercise per Week: 1 day    Minutes of Exercise per Session: 30 min  Stress: Stress Concern Present (01/27/2024)   [employer] of Occupational Health - Occupational Stress Questionnaire    Feeling of Stress: To some extent  Social Connections: Moderately Isolated (01/27/2024)   Social Connection and Isolation Panel    Frequency of Communication with Friends and Family: More than three times a week    Frequency of Social Gatherings with Friends and Family: Once a week    Attends Religious Services: Never    Database Administrator or Organizations: No    Attends Engineer, Structural: Not on file    Marital Status: Married  Catering Manager Violence: Not At Risk (08/03/2022)   Humiliation, Afraid, Rape, and Kick questionnaire    Fear of Current or Ex-Partner: No    Emotionally Abused: No    Physically  Abused: No    Sexually Abused: No    ROS      Objective   BP 118/81   Pulse 80   Temp 98.3 F (36.8 C) (Oral)   Ht 5' 3 (1.6 m)   Wt 208 lb 12.8 oz (94.7 kg)   LMP 01/02/2024 (Approximate)   SpO2 98%   BMI 36.99 kg/m    Physical Exam Vitals and nursing note reviewed.  Constitutional:      Appearance: Normal appearance.  HENT:     Head: Normocephalic and atraumatic.  Eyes:     Conjunctiva/sclera: Conjunctivae normal.  Cardiovascular:     Rate and Rhythm: Normal rate and regular rhythm.  Pulmonary:     Effort: Pulmonary effort is normal.     Breath sounds: Normal breath sounds.  Musculoskeletal:  Right lower leg: No edema.     Left lower leg: No edema.  Skin:    General: Skin is warm and dry.  Neurological:     Mental Status: She is alert and oriented to person, place, and time.  Psychiatric:        Mood and Affect: Mood normal.        Behavior: Behavior normal.        Thought Content: Thought content normal.        Judgment: Judgment normal.            The ASCVD Risk score (Arnett DK, et al., 2019) failed to calculate for the following reasons:   The 2019 ASCVD risk score is only valid for ages 20 to 47     Assessment & Plan:  Fatigue due to excessive exertion, initial encounter Assessment & Plan: Reports that she is tired all of the time.  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  Will check TSH, CBC, CMP vitamin B12 and vitamin D   Orders: -     CBC with Differential/Platelet -     Comprehensive metabolic panel with GFR -     TSH -     T4, free -     VITAMIN D  25 Hydroxy (Vit-D Deficiency, Fractures) -     Vitamin B12; Future -     Pulmonary Visit  Perimenopausal -     FSH/LH -     Estradiol  -     Prolactin  Encounter for immunization -     Flu vaccine trivalent PF, 6mos and older(Flulaval,Afluria,Fluarix,Fluzone)    Return in about 4 weeks (around 02/25/2024).   Wynn Kernes K Oktober Glazer,  MD

## 2024-01-29 ENCOUNTER — Ambulatory Visit: Payer: Self-pay | Admitting: Family Medicine

## 2024-01-29 LAB — COMPREHENSIVE METABOLIC PANEL WITH GFR
ALT: 12 IU/L (ref 0–32)
AST: 17 IU/L (ref 0–40)
Albumin: 4.6 g/dL (ref 3.9–4.9)
Alkaline Phosphatase: 88 IU/L (ref 41–116)
BUN/Creatinine Ratio: 7 — ABNORMAL LOW (ref 9–23)
BUN: 6 mg/dL (ref 6–20)
Bilirubin Total: 0.6 mg/dL (ref 0.0–1.2)
CO2: 23 mmol/L (ref 20–29)
Calcium: 9.3 mg/dL (ref 8.7–10.2)
Chloride: 104 mmol/L (ref 96–106)
Creatinine, Ser: 0.83 mg/dL (ref 0.57–1.00)
Globulin, Total: 2.5 g/dL (ref 1.5–4.5)
Glucose: 98 mg/dL (ref 70–99)
Potassium: 4.4 mmol/L (ref 3.5–5.2)
Sodium: 141 mmol/L (ref 134–144)
Total Protein: 7.1 g/dL (ref 6.0–8.5)
eGFR: 92 mL/min/1.73 (ref >59)

## 2024-01-29 LAB — CBC WITH DIFFERENTIAL/PLATELET
Basophils Absolute: 0.1 x10E3/uL (ref 0.0–0.2)
Basos: 1 %
EOS (ABSOLUTE): 0.2 x10E3/uL (ref 0.0–0.4)
Eos: 2 %
Hematocrit: 41.8 % (ref 34.0–46.6)
Hemoglobin: 13.7 g/dL (ref 11.1–15.9)
Immature Grans (Abs): 0 x10E3/uL (ref 0.0–0.1)
Immature Granulocytes: 0 %
Lymphocytes Absolute: 2.6 x10E3/uL (ref 0.7–3.1)
Lymphs: 31 %
MCH: 32 pg (ref 26.6–33.0)
MCHC: 32.8 g/dL (ref 31.5–35.7)
MCV: 98 fL — ABNORMAL HIGH (ref 79–97)
Monocytes Absolute: 0.3 x10E3/uL (ref 0.1–0.9)
Monocytes: 4 %
Neutrophils Absolute: 5.2 x10E3/uL (ref 1.4–7.0)
Neutrophils: 62 %
Platelets: 295 x10E3/uL (ref 150–450)
RBC: 4.28 x10E6/uL (ref 3.77–5.28)
RDW: 13.3 % (ref 11.7–15.4)
WBC: 8.3 x10E3/uL (ref 3.4–10.8)

## 2024-01-29 LAB — FSH/LH
FSH: 3.5 m[IU]/mL
LH: 3.7 m[IU]/mL

## 2024-01-29 LAB — TSH: TSH: 1.75 u[IU]/mL (ref 0.450–4.500)

## 2024-01-29 LAB — ESTRADIOL: Estradiol: 125 pg/mL

## 2024-01-29 LAB — VITAMIN D 25 HYDROXY (VIT D DEFICIENCY, FRACTURES): Vit D, 25-Hydroxy: 10.4 ng/mL — ABNORMAL LOW (ref 30.0–100.0)

## 2024-01-29 LAB — PROLACTIN: Prolactin: 6.7 ng/mL (ref 4.8–33.4)

## 2024-02-04 ENCOUNTER — Telehealth: Payer: Self-pay | Admitting: Family Medicine

## 2024-02-04 DIAGNOSIS — R3989 Other symptoms and signs involving the genitourinary system: Secondary | ICD-10-CM

## 2024-02-04 MED ORDER — NITROFURANTOIN MONOHYD MACRO 100 MG PO CAPS: 100.0000 mg | ORAL_CAPSULE | Freq: Two times a day (BID) | ORAL | 0 refills | Status: AC

## 2024-02-04 NOTE — Progress Notes (Signed)

## 2024-02-11 ENCOUNTER — Ambulatory Visit: Admitting: Sleep Medicine

## 2024-03-03 ENCOUNTER — Ambulatory Visit: Admitting: Family Medicine
